# Patient Record
Sex: Female | Born: 1940 | Race: White | Hispanic: No | Marital: Married
Health system: Southern US, Academic
[De-identification: ages and names within clinical notes are randomized; demographics above are authoritative.]

## PROBLEM LIST (undated history)

## (undated) ENCOUNTER — Ambulatory Visit: Payer: PRIVATE HEALTH INSURANCE

## (undated) ENCOUNTER — Encounter: Attending: Hematology & Oncology | Primary: Hematology & Oncology

## (undated) ENCOUNTER — Encounter: Attending: Family | Primary: Family

## (undated) ENCOUNTER — Telehealth

## (undated) ENCOUNTER — Ambulatory Visit: Payer: MEDICARE

## (undated) ENCOUNTER — Ambulatory Visit
Payer: MEDICARE | Attending: Student in an Organized Health Care Education/Training Program | Primary: Student in an Organized Health Care Education/Training Program

## (undated) ENCOUNTER — Telehealth: Attending: Hematology & Oncology | Primary: Hematology & Oncology

## (undated) ENCOUNTER — Encounter
Attending: Student in an Organized Health Care Education/Training Program | Primary: Student in an Organized Health Care Education/Training Program

## (undated) ENCOUNTER — Ambulatory Visit: Payer: MEDICARE | Attending: Hematology & Oncology | Primary: Hematology & Oncology

## (undated) ENCOUNTER — Encounter

## (undated) ENCOUNTER — Other Ambulatory Visit

## (undated) ENCOUNTER — Telehealth: Attending: Family | Primary: Family

## (undated) ENCOUNTER — Ambulatory Visit

## (undated) ENCOUNTER — Telehealth
Attending: Student in an Organized Health Care Education/Training Program | Primary: Student in an Organized Health Care Education/Training Program

## (undated) ENCOUNTER — Other Ambulatory Visit: Payer: MEDICARE

## (undated) DIAGNOSIS — Z8673 Personal history of transient ischemic attack (TIA), and cerebral infarction without residual deficits: Secondary | ICD-10-CM

---

## 2014-06-12 DIAGNOSIS — N6011 Diffuse cystic mastopathy of right breast: Secondary | ICD-10-CM | POA: Insufficient documentation

## 2016-06-24 ENCOUNTER — Other Ambulatory Visit: Payer: Self-pay | Admitting: Internal Medicine

## 2016-06-24 ENCOUNTER — Other Ambulatory Visit: Payer: Self-pay | Admitting: Hematology

## 2016-06-24 ENCOUNTER — Ambulatory Visit
Admission: RE | Admit: 2016-06-24 | Discharge: 2016-06-24 | Disposition: A | Discharge status: Home / Self Care | Payer: Medicare Other | Source: Ambulatory Visit | Attending: Internal Medicine | Admitting: Internal Medicine

## 2016-06-24 DIAGNOSIS — R600 Localized edema: Secondary | ICD-10-CM | POA: Diagnosis present

## 2016-11-03 DIAGNOSIS — E119 Type 2 diabetes mellitus without complications: Secondary | ICD-10-CM

## 2017-11-22 IMAGING — US US EXTREM LOW VENOUS BILAT
1 series · 13 of 24 positions shown · non-contrast
Comparison: None.

CLINICAL DATA: 75-year-old female with left greater than right
lower extremity edema for the past week



[Series 1: us extrem low venous bilat · 13 of 68 slices shown]
[im 1/68]
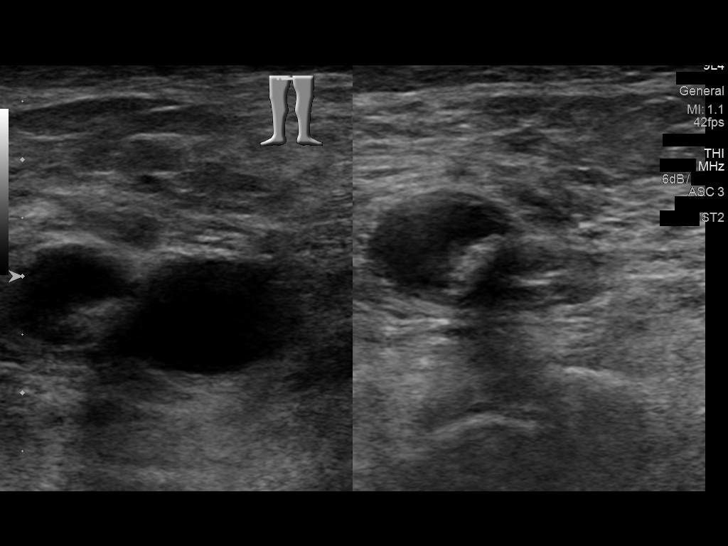
[im 6/68]
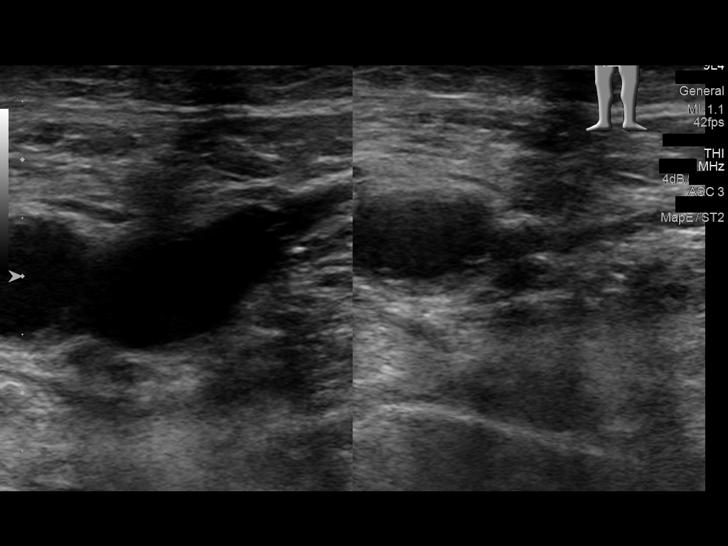
[im 12/68]
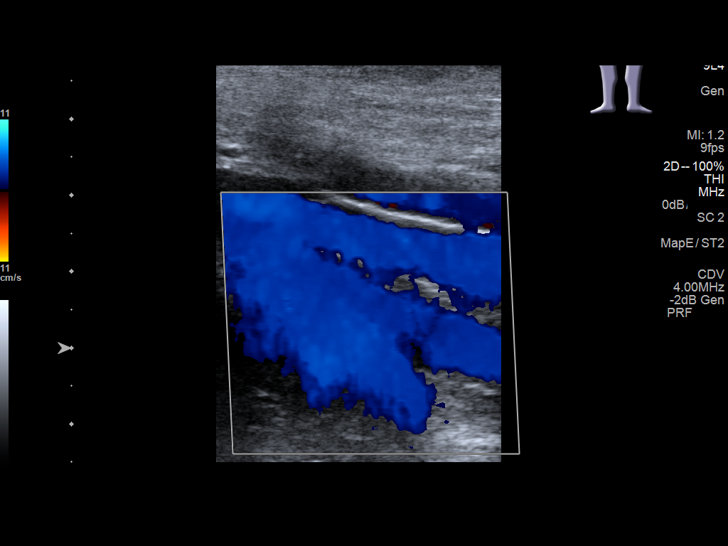
[im 18/68]
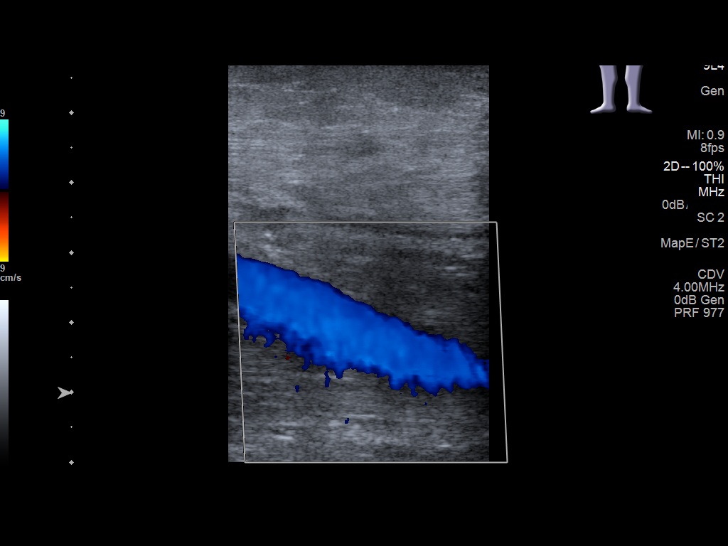
[im 24/68]
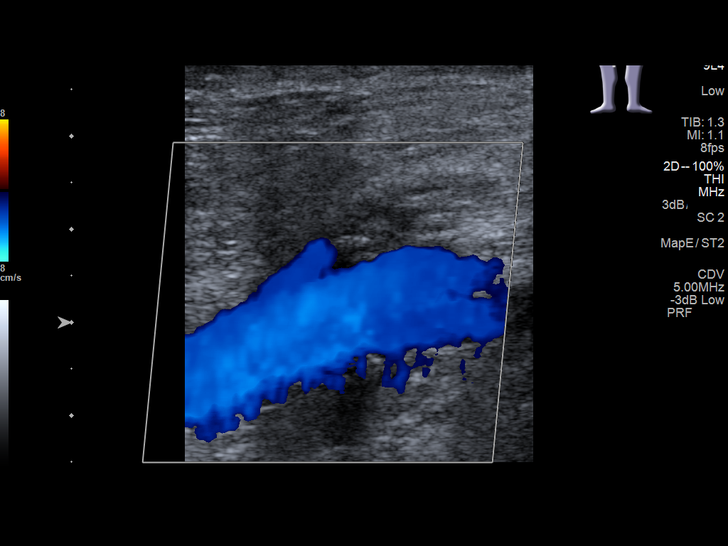
[im 30/68]
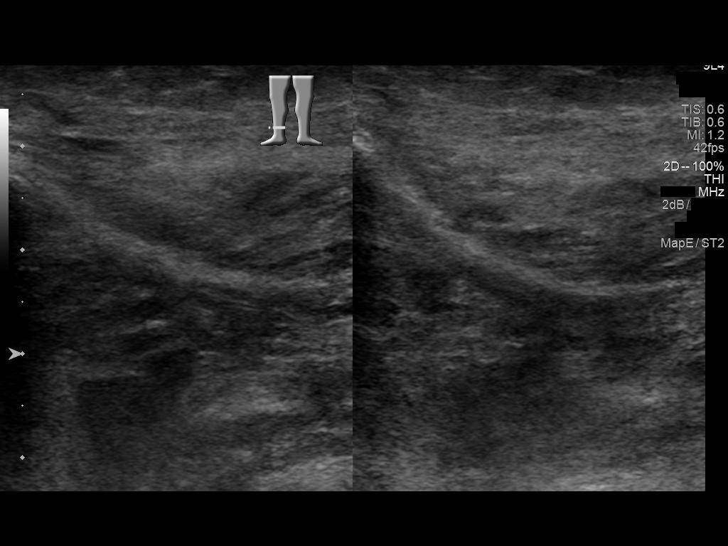
[im 35/68]
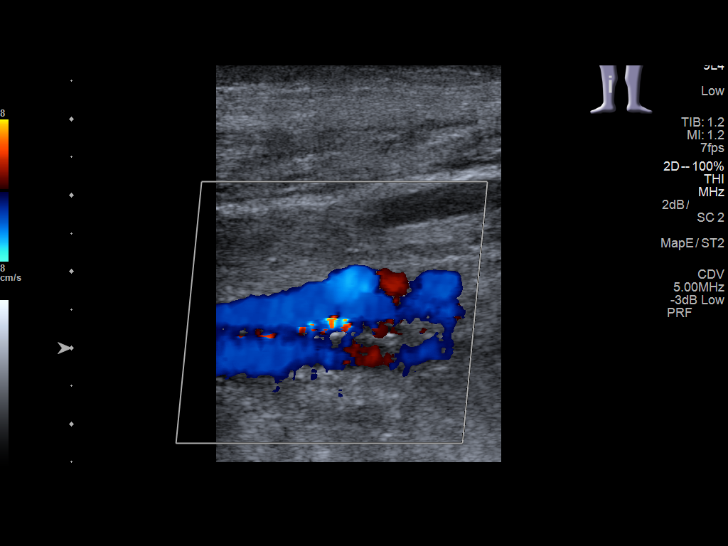
[im 38/68]
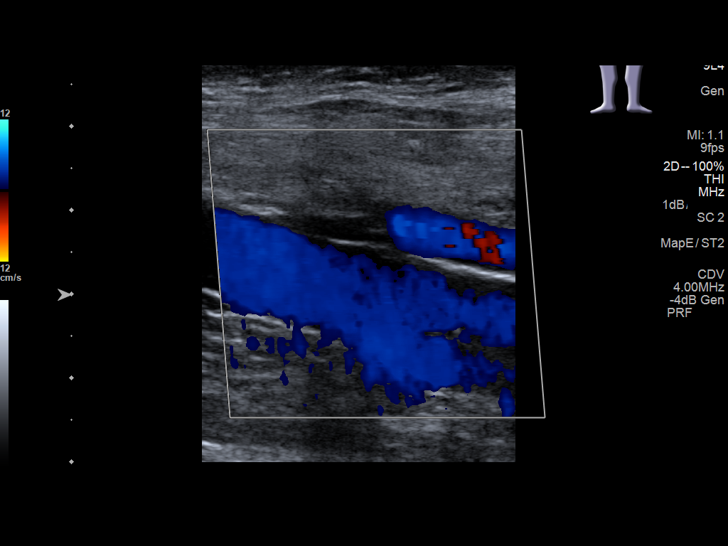
[im 44/68]
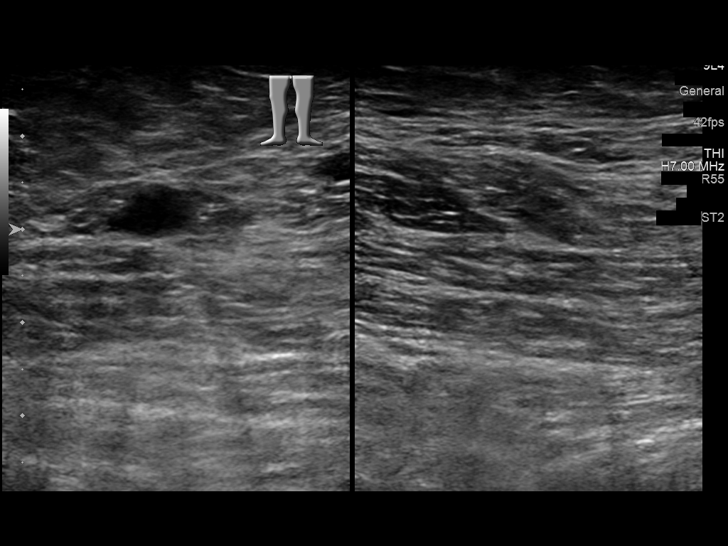
[im 50/68]
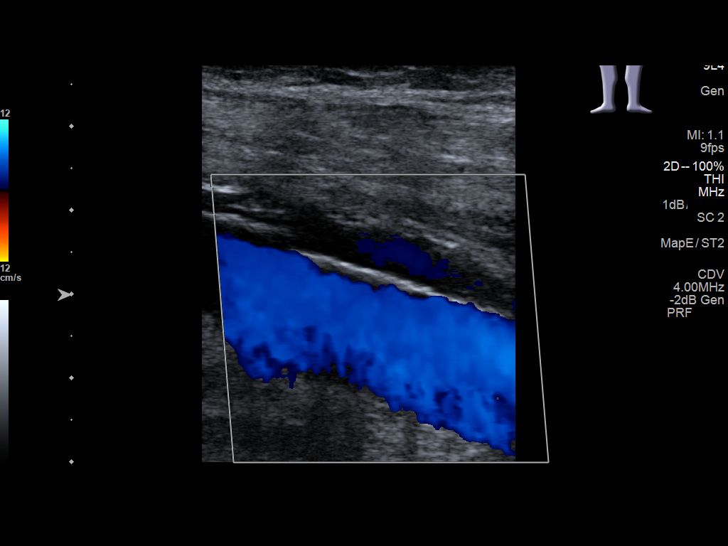
[im 56/68]
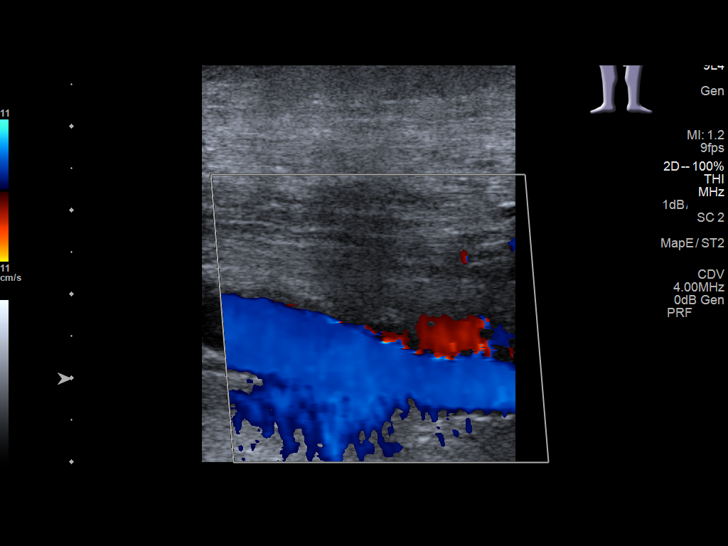
[im 62/68]
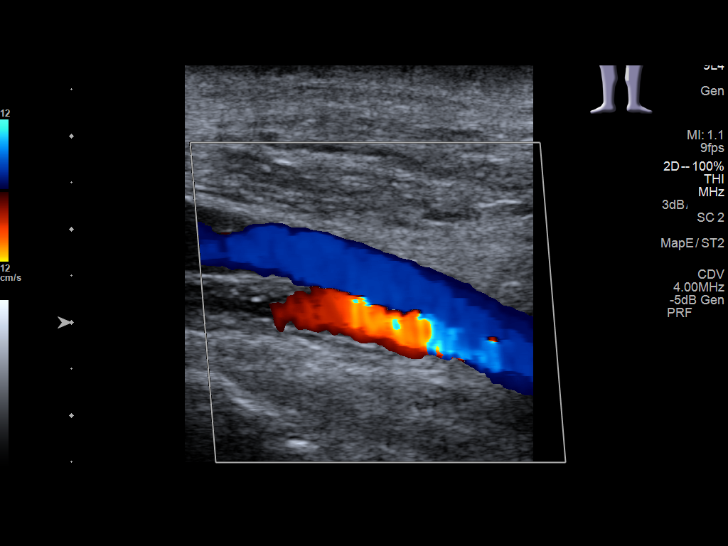
[im 68/68]
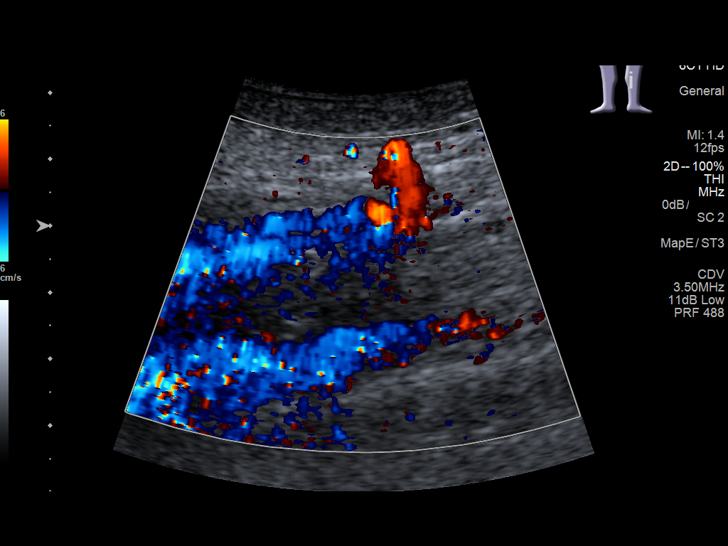

[13 of 24 positions shown; findings below may reference images not displayed]

FINDINGS: RIGHT LOWER EXTREMITY

Common Femoral Vein: No evidence of thrombus. Normal
compressibility, respiratory phasicity and response to augmentation.

Saphenofemoral Junction: No evidence of thrombus. Normal
compressibility and flow on color Doppler imaging.

Profunda Femoral Vein: No evidence of thrombus. Normal
compressibility and flow on color Doppler imaging.

Femoral Vein: No evidence of thrombus. Normal compressibility,
respiratory phasicity and response to augmentation.

Popliteal Vein: No evidence of thrombus. Normal compressibility,
respiratory phasicity and response to augmentation.

Calf Veins: No evidence of thrombus. Normal compressibility and flow
on color Doppler imaging.

Superficial Great Saphenous Vein: No evidence of thrombus. Normal
compressibility and flow on color Doppler imaging.

Venous Reflux:  None.

Other Findings:  None.

LEFT LOWER EXTREMITY

Common Femoral Vein: No evidence of thrombus. Normal
compressibility, respiratory phasicity and response to augmentation.

Saphenofemoral Junction: No evidence of thrombus. Normal
compressibility and flow on color Doppler imaging.

Profunda Femoral Vein: No evidence of thrombus. Normal
compressibility and flow on color Doppler imaging.

Femoral Vein: No evidence of thrombus. Normal compressibility,
respiratory phasicity and response to augmentation.

Popliteal Vein: No evidence of thrombus. Normal compressibility,
respiratory phasicity and response to augmentation.

Calf Veins: No evidence of thrombus. Normal compressibility and flow
on color Doppler imaging.

Superficial Great Saphenous Vein: No evidence of thrombus. Normal
compressibility and flow on color Doppler imaging.

Venous Reflux:  None.

Other Findings:  None.
IMPRESSION: No evidence of deep venous thrombosis.

## 2022-05-10 DIAGNOSIS — Z96651 Presence of right artificial knee joint: Secondary | ICD-10-CM | POA: Insufficient documentation

## 2022-05-10 DIAGNOSIS — M1990 Unspecified osteoarthritis, unspecified site: Secondary | ICD-10-CM | POA: Insufficient documentation

## 2022-05-10 DIAGNOSIS — I69351 Hemiplegia and hemiparesis following cerebral infarction affecting right dominant side: Secondary | ICD-10-CM

## 2022-05-10 DIAGNOSIS — K219 Gastro-esophageal reflux disease without esophagitis: Secondary | ICD-10-CM | POA: Insufficient documentation

## 2022-05-10 DIAGNOSIS — I10 Essential (primary) hypertension: Secondary | ICD-10-CM | POA: Diagnosis present

## 2022-05-10 DIAGNOSIS — I7300 Raynaud's syndrome without gangrene: Secondary | ICD-10-CM | POA: Insufficient documentation

## 2022-05-10 DIAGNOSIS — E785 Hyperlipidemia, unspecified: Secondary | ICD-10-CM | POA: Insufficient documentation

## 2022-07-11 ENCOUNTER — Ambulatory Visit: Admit: 2022-07-11 | Discharge: 2022-07-11 | Disposition: A | Discharge status: Home / Self Care | Payer: MEDICARE

## 2022-07-11 ENCOUNTER — Emergency Department: Admit: 2022-07-11 | Discharge: 2022-07-11 | Disposition: A | Discharge status: Home / Self Care | Payer: MEDICARE

## 2022-07-11 DIAGNOSIS — S0101XA Laceration without foreign body of scalp, initial encounter: Principal | ICD-10-CM

## 2022-07-11 DIAGNOSIS — W19XXXA Unspecified fall, initial encounter: Principal | ICD-10-CM

## 2022-07-13 ENCOUNTER — Ambulatory Visit: Admit: 2022-07-13 | Discharge: 2022-07-13 | Payer: MEDICARE | Attending: Family | Primary: Family

## 2022-07-13 ENCOUNTER — Ambulatory Visit: Admit: 2022-07-13 | Discharge: 2022-07-13 | Payer: MEDICARE

## 2022-07-13 DIAGNOSIS — E119 Type 2 diabetes mellitus without complications: Principal | ICD-10-CM

## 2022-07-13 DIAGNOSIS — D649 Anemia, unspecified: Principal | ICD-10-CM

## 2022-07-13 DIAGNOSIS — Z8673 Personal history of transient ischemic attack (TIA), and cerebral infarction without residual deficits: Principal | ICD-10-CM

## 2022-07-13 DIAGNOSIS — R609 Edema, unspecified: Principal | ICD-10-CM

## 2022-07-13 DIAGNOSIS — I10 Essential (primary) hypertension: Principal | ICD-10-CM

## 2022-07-13 DIAGNOSIS — R7989 Other specified abnormal findings of blood chemistry: Principal | ICD-10-CM

## 2022-07-13 MED ORDER — ATORVASTATIN 80 MG TABLET
ORAL_TABLET | Freq: Every day | ORAL | 0 refills | 90 days | Status: CP
Start: 2022-07-13 — End: ?

## 2022-07-13 MED ORDER — PANTOPRAZOLE 20 MG TABLET,DELAYED RELEASE
ORAL_TABLET | Freq: Every day | ORAL | 0 refills | 90 days | Status: CP
Start: 2022-07-13 — End: ?

## 2022-07-13 MED ORDER — CLOPIDOGREL 75 MG TABLET
ORAL_TABLET | Freq: Every day | ORAL | 0 refills | 90 days | Status: CP
Start: 2022-07-13 — End: ?

## 2022-07-13 MED ORDER — METFORMIN 500 MG TABLET
ORAL_TABLET | Freq: Two times a day (BID) | ORAL | 0 refills | 90 days | Status: CP
Start: 2022-07-13 — End: ?

## 2022-07-13 MED ORDER — TAMSULOSIN 0.4 MG CAPSULE
ORAL_CAPSULE | Freq: Every day | ORAL | 0 refills | 90 days | Status: CP
Start: 2022-07-13 — End: ?

## 2022-07-13 MED ORDER — DONEPEZIL 5 MG TABLET
ORAL_TABLET | Freq: Every evening | ORAL | 0 refills | 30 days | Status: CP
Start: 2022-07-13 — End: 2022-08-12

## 2022-07-13 MED ORDER — FUROSEMIDE 20 MG TABLET
ORAL_TABLET | Freq: Every day | ORAL | 0 refills | 60 days | Status: CP | PRN
Start: 2022-07-13 — End: 2022-08-12

## 2022-07-13 MED ORDER — MIRTAZAPINE 15 MG DISINTEGRATING TABLET
ORAL_TABLET | Freq: Every evening | ORAL | 0 refills | 90 days | Status: CP
Start: 2022-07-13 — End: ?

## 2022-07-13 MED ORDER — TELMISARTAN 80 MG TABLET
ORAL_TABLET | Freq: Every day | ORAL | 0 refills | 90 days | Status: CP
Start: 2022-07-13 — End: ?

## 2022-07-13 MED ORDER — AMLODIPINE 5 MG TABLET
ORAL_TABLET | Freq: Every day | ORAL | 0 refills | 90 days | Status: CP
Start: 2022-07-13 — End: ?

## 2022-07-19 DIAGNOSIS — E876 Hypokalemia: Principal | ICD-10-CM

## 2022-07-19 MED ORDER — POTASSIUM CHLORIDE ER 20 MEQ TABLET,EXTENDED RELEASE(PART/CRYST)
ORAL_TABLET | Freq: Three times a day (TID) | ORAL | 0 refills | 7 days | Status: CP
Start: 2022-07-19 — End: 2022-07-26

## 2022-07-27 DIAGNOSIS — R197 Diarrhea, unspecified: Principal | ICD-10-CM

## 2022-07-29 ENCOUNTER — Encounter: Admit: 2022-07-29 | Discharge: 2022-07-30 | Payer: MEDICARE | Attending: Family | Primary: Family

## 2022-07-29 ENCOUNTER — Other Ambulatory Visit: Admit: 2022-07-29 | Discharge: 2022-07-30 | Payer: MEDICARE

## 2022-07-29 DIAGNOSIS — R197 Diarrhea, unspecified: Principal | ICD-10-CM

## 2022-08-04 DIAGNOSIS — R197 Diarrhea, unspecified: Principal | ICD-10-CM

## 2022-08-04 DIAGNOSIS — I10 Essential (primary) hypertension: Principal | ICD-10-CM

## 2022-08-04 DIAGNOSIS — E119 Type 2 diabetes mellitus without complications: Principal | ICD-10-CM

## 2022-08-04 DIAGNOSIS — Z8673 Personal history of transient ischemic attack (TIA), and cerebral infarction without residual deficits: Principal | ICD-10-CM

## 2022-08-04 DIAGNOSIS — R609 Edema, unspecified: Principal | ICD-10-CM

## 2022-08-05 MED ORDER — METFORMIN 500 MG TABLET
ORAL_TABLET | Freq: Two times a day (BID) | ORAL | 0 refills | 90 days | Status: CP
Start: 2022-08-05 — End: ?

## 2022-08-05 MED ORDER — ATORVASTATIN 80 MG TABLET
ORAL_TABLET | Freq: Every day | ORAL | 0 refills | 90 days | Status: CP
Start: 2022-08-05 — End: ?

## 2022-08-05 MED ORDER — METHOCARBAMOL 750 MG TABLET
ORAL_TABLET | Freq: Four times a day (QID) | ORAL | 2 refills | 30 days | Status: CP
Start: 2022-08-05 — End: 2022-09-04

## 2022-08-05 MED ORDER — CLOPIDOGREL 75 MG TABLET
ORAL_TABLET | Freq: Every day | ORAL | 0 refills | 90 days | Status: CP
Start: 2022-08-05 — End: ?

## 2022-08-05 MED ORDER — MIRTAZAPINE 15 MG DISINTEGRATING TABLET
ORAL_TABLET | Freq: Every evening | ORAL | 0 refills | 90 days | Status: CP
Start: 2022-08-05 — End: ?

## 2022-08-05 MED ORDER — AMLODIPINE 5 MG TABLET
ORAL_TABLET | Freq: Every day | ORAL | 0 refills | 90 days | Status: CP
Start: 2022-08-05 — End: ?

## 2022-08-05 MED ORDER — PANTOPRAZOLE 20 MG TABLET,DELAYED RELEASE
ORAL_TABLET | Freq: Every day | ORAL | 0 refills | 90 days | Status: CP
Start: 2022-08-05 — End: ?

## 2022-08-05 MED ORDER — TELMISARTAN 80 MG TABLET
ORAL_TABLET | Freq: Every day | ORAL | 0 refills | 90 days | Status: CP
Start: 2022-08-05 — End: ?

## 2022-08-05 MED ORDER — FUROSEMIDE 20 MG TABLET
ORAL_TABLET | Freq: Every day | ORAL | 0 refills | 60 days | Status: CP | PRN
Start: 2022-08-05 — End: 2022-09-04

## 2022-08-08 MED ORDER — AMLODIPINE 5 MG TABLET
ORAL_TABLET | Freq: Two times a day (BID) | ORAL | 1 refills | 45 days | Status: CP
Start: 2022-08-08 — End: 2022-11-06

## 2022-08-08 MED ORDER — TAMSULOSIN 0.4 MG CAPSULE
ORAL_CAPSULE | Freq: Every day | ORAL | 1 refills | 90 days | Status: CP
Start: 2022-08-08 — End: ?

## 2022-08-15 ENCOUNTER — Ambulatory Visit: Admit: 2022-08-15 | Discharge: 2022-08-16 | Payer: MEDICARE

## 2022-08-15 DIAGNOSIS — Z8673 Personal history of transient ischemic attack (TIA), and cerebral infarction without residual deficits: Principal | ICD-10-CM

## 2022-08-15 DIAGNOSIS — I639 Cerebral infarction, unspecified: Principal | ICD-10-CM

## 2022-08-17 ENCOUNTER — Ambulatory Visit
Admit: 2022-08-17 | Discharge: 2022-08-18 | Payer: MEDICARE | Attending: Student in an Organized Health Care Education/Training Program | Primary: Student in an Organized Health Care Education/Training Program

## 2022-08-17 ENCOUNTER — Ambulatory Visit: Admit: 2022-08-17 | Discharge: 2022-08-18 | Payer: MEDICARE

## 2022-08-17 DIAGNOSIS — K5900 Constipation, unspecified: Principal | ICD-10-CM

## 2022-08-17 DIAGNOSIS — R197 Diarrhea, unspecified: Principal | ICD-10-CM

## 2022-08-29 MED ORDER — DONEPEZIL 10 MG TABLET
ORAL_TABLET | Freq: Every evening | ORAL | 2 refills | 30 days | Status: CP
Start: 2022-08-29 — End: 2022-09-28

## 2022-09-12 MED ORDER — AMLODIPINE 5 MG TABLET
ORAL_TABLET | Freq: Two times a day (BID) | ORAL | 1 refills | 45 days | Status: CP
Start: 2022-09-12 — End: 2022-12-11

## 2022-09-12 MED ORDER — BLOOD GLUCOSE TEST STRIPS
ORAL_STRIP | Freq: Once | 2 refills | 0 days | Status: CP
Start: 2022-09-12 — End: 2022-09-12

## 2022-09-19 DIAGNOSIS — E119 Type 2 diabetes mellitus without complications: Principal | ICD-10-CM

## 2022-09-19 MED ORDER — BLOOD-GLUCOSE METER KIT WRAPPER
0 refills | 0 days | Status: CP
Start: 2022-09-19 — End: ?

## 2022-10-03 ENCOUNTER — Ambulatory Visit
Admit: 2022-10-03 | Discharge: 2022-10-04 | Payer: MEDICARE | Attending: Hematology & Oncology | Primary: Hematology & Oncology

## 2022-10-03 DIAGNOSIS — K219 Gastro-esophageal reflux disease without esophagitis: Principal | ICD-10-CM

## 2022-10-03 DIAGNOSIS — E7800 Pure hypercholesterolemia, unspecified: Principal | ICD-10-CM

## 2022-10-03 DIAGNOSIS — I10 Essential (primary) hypertension: Principal | ICD-10-CM

## 2022-10-03 DIAGNOSIS — Z8673 Personal history of transient ischemic attack (TIA), and cerebral infarction without residual deficits: Principal | ICD-10-CM

## 2022-10-03 DIAGNOSIS — S81819A Laceration without foreign body, unspecified lower leg, initial encounter: Principal | ICD-10-CM

## 2022-10-03 DIAGNOSIS — Z1329 Encounter for screening for other suspected endocrine disorder: Principal | ICD-10-CM

## 2022-10-03 DIAGNOSIS — Z131 Encounter for screening for diabetes mellitus: Principal | ICD-10-CM

## 2022-10-03 DIAGNOSIS — E119 Type 2 diabetes mellitus without complications: Principal | ICD-10-CM

## 2022-10-03 DIAGNOSIS — Z7689 Persons encountering health services in other specified circumstances: Principal | ICD-10-CM

## 2022-10-03 MED ORDER — CEPHALEXIN 500 MG CAPSULE
ORAL_CAPSULE | Freq: Three times a day (TID) | ORAL | 0 refills | 7 days | Status: CP
Start: 2022-10-03 — End: 2022-10-10

## 2022-10-12 DIAGNOSIS — R609 Edema, unspecified: Principal | ICD-10-CM

## 2022-10-17 DIAGNOSIS — I639 Cerebral infarction, unspecified: Principal | ICD-10-CM

## 2022-10-31 DIAGNOSIS — Z8673 Personal history of transient ischemic attack (TIA), and cerebral infarction without residual deficits: Principal | ICD-10-CM

## 2022-10-31 DIAGNOSIS — E119 Type 2 diabetes mellitus without complications: Principal | ICD-10-CM

## 2022-11-01 MED ORDER — ATORVASTATIN 80 MG TABLET
ORAL_TABLET | Freq: Every day | ORAL | 0 refills | 90 days | Status: CP
Start: 2022-11-01 — End: ?

## 2022-11-01 MED ORDER — PANTOPRAZOLE 20 MG TABLET,DELAYED RELEASE
ORAL_TABLET | Freq: Every day | ORAL | 0 refills | 90 days | Status: CP
Start: 2022-11-01 — End: ?

## 2022-11-01 MED ORDER — MIRTAZAPINE 15 MG DISINTEGRATING TABLET
ORAL_TABLET | Freq: Every evening | ORAL | 0 refills | 90 days | Status: CP
Start: 2022-11-01 — End: ?

## 2022-11-14 ENCOUNTER — Ambulatory Visit: Admit: 2022-11-14 | Discharge: 2022-11-15 | Payer: MEDICARE

## 2022-11-14 DIAGNOSIS — R413 Other amnesia: Principal | ICD-10-CM

## 2022-11-14 DIAGNOSIS — I6389 Other cerebral infarction: Principal | ICD-10-CM

## 2022-11-14 DIAGNOSIS — E119 Type 2 diabetes mellitus without complications: Principal | ICD-10-CM

## 2022-11-14 DIAGNOSIS — I639 Cerebral infarction, unspecified: Principal | ICD-10-CM

## 2022-11-15 MED ORDER — METFORMIN 500 MG TABLET
ORAL_TABLET | Freq: Two times a day (BID) | ORAL | 1 refills | 90 days | Status: CP
Start: 2022-11-15 — End: 2023-11-15

## 2022-11-16 ENCOUNTER — Ambulatory Visit
Admit: 2022-11-16 | Discharge: 2022-11-17 | Payer: MEDICARE | Attending: Student in an Organized Health Care Education/Training Program | Primary: Student in an Organized Health Care Education/Training Program

## 2022-11-21 ENCOUNTER — Ambulatory Visit: Admit: 2022-11-21 | Discharge: 2022-11-22 | Payer: MEDICARE

## 2022-11-27 DIAGNOSIS — Z8673 Personal history of transient ischemic attack (TIA), and cerebral infarction without residual deficits: Principal | ICD-10-CM

## 2022-11-29 MED ORDER — CLOPIDOGREL 75 MG TABLET
ORAL_TABLET | Freq: Every day | ORAL | 0 refills | 90 days | Status: CP
Start: 2022-11-29 — End: ?

## 2022-11-29 MED ORDER — DONEPEZIL 10 MG TABLET
ORAL_TABLET | Freq: Every evening | ORAL | 0 refills | 90 days | Status: CP
Start: 2022-11-29 — End: 2023-02-27

## 2022-12-06 DIAGNOSIS — I10 Essential (primary) hypertension: Principal | ICD-10-CM

## 2022-12-07 MED ORDER — TELMISARTAN 80 MG TABLET
ORAL_TABLET | Freq: Every day | ORAL | 0 refills | 90 days | Status: CP
Start: 2022-12-07 — End: ?

## 2022-12-20 MED ORDER — AMLODIPINE 5 MG TABLET
ORAL_TABLET | Freq: Two times a day (BID) | ORAL | 1 refills | 45 days | Status: CP
Start: 2022-12-20 — End: 2023-03-20

## 2023-01-02 ENCOUNTER — Ambulatory Visit: Admit: 2023-01-02 | Discharge: 2023-01-03 | Payer: MEDICARE | Attending: Family | Primary: Family

## 2023-01-02 DIAGNOSIS — E119 Type 2 diabetes mellitus without complications: Principal | ICD-10-CM

## 2023-01-23 MED ORDER — PANTOPRAZOLE 20 MG TABLET,DELAYED RELEASE
ORAL_TABLET | Freq: Every day | ORAL | 0 refills | 90 days | Status: CP
Start: 2023-01-23 — End: ?

## 2023-02-02 ENCOUNTER — Ambulatory Visit
Admit: 2023-02-02 | Discharge: 2023-02-03 | Payer: MEDICARE | Attending: Hematology & Oncology | Primary: Hematology & Oncology

## 2023-02-02 DIAGNOSIS — Z8673 Personal history of transient ischemic attack (TIA), and cerebral infarction without residual deficits: Principal | ICD-10-CM

## 2023-02-02 DIAGNOSIS — Z13820 Encounter for screening for osteoporosis: Principal | ICD-10-CM

## 2023-02-02 DIAGNOSIS — E119 Type 2 diabetes mellitus without complications: Principal | ICD-10-CM

## 2023-02-02 DIAGNOSIS — Z1329 Encounter for screening for other suspected endocrine disorder: Principal | ICD-10-CM

## 2023-02-02 DIAGNOSIS — Z79899 Other long term (current) drug therapy: Principal | ICD-10-CM

## 2023-02-02 DIAGNOSIS — I10 Essential (primary) hypertension: Principal | ICD-10-CM

## 2023-02-02 DIAGNOSIS — Z0000 Encounter for general adult medical examination without abnormal findings: Principal | ICD-10-CM

## 2023-02-02 DIAGNOSIS — K219 Gastro-esophageal reflux disease without esophagitis: Principal | ICD-10-CM

## 2023-02-02 DIAGNOSIS — E7800 Pure hypercholesterolemia, unspecified: Principal | ICD-10-CM

## 2023-02-02 DIAGNOSIS — Z130 Encounter for screening for diseases of the blood and blood-forming organs and certain disorders involving the immune mechanism: Principal | ICD-10-CM

## 2023-02-02 MED ORDER — METFORMIN 500 MG TABLET
ORAL_TABLET | Freq: Two times a day (BID) | ORAL | 1 refills | 90 days | Status: CP
Start: 2023-02-02 — End: 2024-02-02

## 2023-02-04 ENCOUNTER — Encounter
Admission: EM | Disposition: A | Discharge status: Skilled Nursing Facility | Payer: Self-pay | Source: Home / Self Care | Attending: Internal Medicine

## 2023-02-04 ENCOUNTER — Inpatient Hospital Stay: Payer: Medicare Other

## 2023-02-04 ENCOUNTER — Inpatient Hospital Stay: Payer: Medicare Other | Admitting: Anesthesiology

## 2023-02-04 ENCOUNTER — Emergency Department: Payer: Medicare Other

## 2023-02-04 ENCOUNTER — Other Ambulatory Visit: Payer: Self-pay

## 2023-02-04 ENCOUNTER — Inpatient Hospital Stay
Admission: EM | Admit: 2023-02-04 | Discharge: 2023-02-09 | DRG: 956 | Disposition: A | Discharge status: Skilled Nursing Facility | Payer: Medicare Other | Attending: Internal Medicine | Admitting: Internal Medicine

## 2023-02-04 ENCOUNTER — Encounter: Payer: Self-pay | Admitting: Internal Medicine

## 2023-02-04 DIAGNOSIS — Y92039 Unspecified place in apartment as the place of occurrence of the external cause: Secondary | ICD-10-CM

## 2023-02-04 DIAGNOSIS — K5909 Other constipation: Secondary | ICD-10-CM | POA: Diagnosis present

## 2023-02-04 DIAGNOSIS — N179 Acute kidney failure, unspecified: Secondary | ICD-10-CM | POA: Diagnosis not present

## 2023-02-04 DIAGNOSIS — E785 Hyperlipidemia, unspecified: Secondary | ICD-10-CM | POA: Diagnosis present

## 2023-02-04 DIAGNOSIS — E860 Dehydration: Secondary | ICD-10-CM | POA: Diagnosis present

## 2023-02-04 DIAGNOSIS — S72141A Displaced intertrochanteric fracture of right femur, initial encounter for closed fracture: Secondary | ICD-10-CM | POA: Diagnosis not present

## 2023-02-04 DIAGNOSIS — K219 Gastro-esophageal reflux disease without esophagitis: Secondary | ICD-10-CM | POA: Diagnosis present

## 2023-02-04 DIAGNOSIS — R4189 Other symptoms and signs involving cognitive functions and awareness: Secondary | ICD-10-CM | POA: Diagnosis present

## 2023-02-04 DIAGNOSIS — I69351 Hemiplegia and hemiparesis following cerebral infarction affecting right dominant side: Secondary | ICD-10-CM | POA: Diagnosis not present

## 2023-02-04 DIAGNOSIS — S32591A Other specified fracture of right pubis, initial encounter for closed fracture: Secondary | ICD-10-CM | POA: Diagnosis present

## 2023-02-04 DIAGNOSIS — W19XXXA Unspecified fall, initial encounter: Secondary | ICD-10-CM

## 2023-02-04 DIAGNOSIS — W1830XA Fall on same level, unspecified, initial encounter: Secondary | ICD-10-CM | POA: Diagnosis not present

## 2023-02-04 DIAGNOSIS — F03918 Unspecified dementia, unspecified severity, with other behavioral disturbance: Secondary | ICD-10-CM | POA: Diagnosis present

## 2023-02-04 DIAGNOSIS — F09 Unspecified mental disorder due to known physiological condition: Secondary | ICD-10-CM

## 2023-02-04 DIAGNOSIS — E119 Type 2 diabetes mellitus without complications: Secondary | ICD-10-CM | POA: Diagnosis present

## 2023-02-04 DIAGNOSIS — Z9181 History of falling: Secondary | ICD-10-CM

## 2023-02-04 DIAGNOSIS — I10 Essential (primary) hypertension: Secondary | ICD-10-CM | POA: Diagnosis present

## 2023-02-04 DIAGNOSIS — S32511A Fracture of superior rim of right pubis, initial encounter for closed fracture: Secondary | ICD-10-CM | POA: Diagnosis not present

## 2023-02-04 DIAGNOSIS — Y9301 Activity, walking, marching and hiking: Secondary | ICD-10-CM | POA: Diagnosis present

## 2023-02-04 DIAGNOSIS — Z7984 Long term (current) use of oral hypoglycemic drugs: Secondary | ICD-10-CM | POA: Diagnosis not present

## 2023-02-04 DIAGNOSIS — W010XXA Fall on same level from slipping, tripping and stumbling without subsequent striking against object, initial encounter: Secondary | ICD-10-CM | POA: Diagnosis present

## 2023-02-04 DIAGNOSIS — D62 Acute posthemorrhagic anemia: Secondary | ICD-10-CM | POA: Diagnosis not present

## 2023-02-04 HISTORY — DX: Personal history of transient ischemic attack (TIA), and cerebral infarction without residual deficits: Z86.73

## 2023-02-04 HISTORY — PX: INTRAMEDULLARY (IM) NAIL INTERTROCHANTERIC: SHX5875

## 2023-02-04 LAB — HEMOGLOBIN A1C
Hgb A1c MFr Bld: 7.3 % — ABNORMAL HIGH (ref 4.8–5.6)
Mean Plasma Glucose: 162.81 mg/dL

## 2023-02-04 LAB — CBC WITH DIFFERENTIAL/PLATELET
Abs Immature Granulocytes: 0.08 10*3/uL — ABNORMAL HIGH (ref 0.00–0.07)
Basophils Absolute: 0.1 10*3/uL (ref 0.0–0.1)
Basophils Relative: 1 %
Eosinophils Absolute: 0.2 10*3/uL (ref 0.0–0.5)
Eosinophils Relative: 2 %
HCT: 31.7 % — ABNORMAL LOW (ref 36.0–46.0)
Hemoglobin: 10.1 g/dL — ABNORMAL LOW (ref 12.0–15.0)
Immature Granulocytes: 1 %
Lymphocytes Relative: 39 %
Lymphs Abs: 4.8 10*3/uL — ABNORMAL HIGH (ref 0.7–4.0)
MCH: 27.1 pg (ref 26.0–34.0)
MCHC: 31.9 g/dL (ref 30.0–36.0)
MCV: 85 fL (ref 80.0–100.0)
Monocytes Absolute: 0.9 10*3/uL (ref 0.1–1.0)
Monocytes Relative: 7 %
Neutro Abs: 6.1 10*3/uL (ref 1.7–7.7)
Neutrophils Relative %: 50 %
Platelets: 233 10*3/uL (ref 150–400)
RBC: 3.73 MIL/uL — ABNORMAL LOW (ref 3.87–5.11)
RDW: 16.4 % — ABNORMAL HIGH (ref 11.5–15.5)
WBC: 12.1 10*3/uL — ABNORMAL HIGH (ref 4.0–10.5)
nRBC: 0 % (ref 0.0–0.2)

## 2023-02-04 LAB — GLUCOSE, CAPILLARY
Glucose-Capillary: 160 mg/dL — ABNORMAL HIGH (ref 70–99)
Glucose-Capillary: 150 mg/dL — ABNORMAL HIGH (ref 70–99)
Glucose-Capillary: 161 mg/dL — ABNORMAL HIGH (ref 70–99)
Glucose-Capillary: 150 mg/dL — ABNORMAL HIGH (ref 70–99)
Glucose-Capillary: 211 mg/dL — ABNORMAL HIGH (ref 70–99)
Glucose-Capillary: 210 mg/dL — ABNORMAL HIGH (ref 70–99)

## 2023-02-04 LAB — BASIC METABOLIC PANEL
Anion gap: 12 (ref 5–15)
BUN: 20 mg/dL (ref 8–23)
CO2: 22 mmol/L (ref 22–32)
Calcium: 9.6 mg/dL (ref 8.9–10.3)
Chloride: 103 mmol/L (ref 98–111)
Creatinine, Ser: 1.06 mg/dL — ABNORMAL HIGH (ref 0.44–1.00)
GFR, Estimated: 53 mL/min — ABNORMAL LOW (ref >60)
Glucose, Bld: 157 mg/dL — ABNORMAL HIGH (ref 70–99)
Potassium: 3.8 mmol/L (ref 3.5–5.1)
Sodium: 137 mmol/L (ref 135–145)

## 2023-02-04 LAB — TYPE AND SCREEN

## 2023-02-04 LAB — PROTIME-INR
INR: 1.1 (ref 0.8–1.2)
Prothrombin Time: 14.2 seconds (ref 11.4–15.2)

## 2023-02-04 SURGERY — FIXATION, FRACTURE, INTERTROCHANTERIC, WITH INTRAMEDULLARY ROD
Anesthesia: General | Laterality: Right

## 2023-02-04 MED ORDER — EPHEDRINE SULFATE (PRESSORS) 50 MG/ML IJ SOLN
INTRAMUSCULAR | Status: DC | PRN
  Administered 2023-02-04 (×3): 2.5 mg via INTRAVENOUS

## 2023-02-04 MED ORDER — INSULIN ASPART 100 UNIT/ML IJ SOLN
0.0000 [IU] | INTRAMUSCULAR | Status: DC
  Administered 2023-02-04: 1 [IU] via SUBCUTANEOUS
  Administered 2023-02-04: 2 [IU] via SUBCUTANEOUS
  Administered 2023-02-04: 3 [IU] via SUBCUTANEOUS
  Administered 2023-02-04 – 2023-02-05 (×2): 2 [IU] via SUBCUTANEOUS
  Administered 2023-02-05 (×2): 3 [IU] via SUBCUTANEOUS
  Filled 2023-02-04 (×6): qty 1

## 2023-02-04 MED ORDER — ACETAMINOPHEN 10 MG/ML IV SOLN
INTRAVENOUS | Status: DC | PRN
  Administered 2023-02-04: 1000 mg via INTRAVENOUS

## 2023-02-04 MED ORDER — HYDROCODONE-ACETAMINOPHEN 5-325 MG PO TABS
1.0000 | ORAL_TABLET | Freq: Four times a day (QID) | ORAL | Status: DC | PRN
  Administered 2023-02-06 – 2023-02-09 (×5): 1 via ORAL
  Filled 2023-02-04 (×5): qty 1

## 2023-02-04 MED ORDER — CEFAZOLIN SODIUM-DEXTROSE 1-4 GM/50ML-% IV SOLN
1.0000 g | Freq: Three times a day (TID) | INTRAVENOUS | Status: AC
  Administered 2023-02-05 (×3): 1 g via INTRAVENOUS
  Filled 2023-02-04 (×4): qty 50

## 2023-02-04 MED ORDER — FENTANYL CITRATE (PF) 100 MCG/2ML IJ SOLN
INTRAMUSCULAR | Status: DC | PRN
  Administered 2023-02-04: 50 ug via INTRAVENOUS
  Administered 2023-02-04 (×2): 25 ug via INTRAVENOUS

## 2023-02-04 MED ORDER — FENTANYL CITRATE (PF) 100 MCG/2ML IJ SOLN
INTRAMUSCULAR | Status: AC
  Filled 2023-02-04: qty 2

## 2023-02-04 MED ORDER — TRANEXAMIC ACID-NACL 1000-0.7 MG/100ML-% IV SOLN
INTRAVENOUS | Status: DC | PRN
Start: 2023-02-04 — End: 2023-02-04
  Administered 2023-02-04: 1000 mg via INTRAVENOUS

## 2023-02-04 MED ORDER — HYDROCODONE-ACETAMINOPHEN 5-325 MG PO TABS: 1.0000 | ORAL_TABLET | Freq: Four times a day (QID) | ORAL | Status: DC | PRN

## 2023-02-04 MED ORDER — ACETAMINOPHEN 325 MG PO TABS
650.0000 mg | ORAL_TABLET | Freq: Four times a day (QID) | ORAL | Status: DC | PRN
  Administered 2023-02-05 – 2023-02-07 (×2): 650 mg via ORAL
  Filled 2023-02-04 (×2): qty 2

## 2023-02-04 MED ORDER — BUPIVACAINE-EPINEPHRINE (PF) 0.5% -1:200000 IJ SOLN
INTRAMUSCULAR | Status: DC | PRN
  Administered 2023-02-04: 45 mL via SURGICAL_CAVITY

## 2023-02-04 MED ORDER — HYDROMORPHONE HCL 1 MG/ML IJ SOLN
0.5000 mg | INTRAMUSCULAR | Status: DC | PRN
  Administered 2023-02-04 – 2023-02-06 (×4): 0.5 mg via INTRAVENOUS
  Filled 2023-02-04 (×4): qty 0.5

## 2023-02-04 MED ORDER — HYDRALAZINE HCL 20 MG/ML IJ SOLN: 5.0000 mg | Freq: Four times a day (QID) | INTRAMUSCULAR | Status: DC | PRN

## 2023-02-04 MED ORDER — 0.9 % SODIUM CHLORIDE (POUR BTL) OPTIME
TOPICAL | Status: DC | PRN
  Administered 2023-02-04: 1000 mL

## 2023-02-04 MED ORDER — PROPOFOL 10 MG/ML IV BOLUS
INTRAVENOUS | Status: DC | PRN
Start: 2023-02-04 — End: 2023-02-04
  Administered 2023-02-04: 80 mg via INTRAVENOUS

## 2023-02-04 MED ORDER — FENTANYL CITRATE (PF) 100 MCG/2ML IJ SOLN: 25.0000 ug | INTRAMUSCULAR | Status: DC | PRN

## 2023-02-04 MED ORDER — LACTATED RINGERS IV SOLN: INTRAVENOUS | Status: AC

## 2023-02-04 MED ORDER — ROCURONIUM BROMIDE 10 MG/ML (PF) SYRINGE
PREFILLED_SYRINGE | INTRAVENOUS | Status: AC
  Filled 2023-02-04: qty 10

## 2023-02-04 MED ORDER — ROCURONIUM BROMIDE 100 MG/10ML IV SOLN
INTRAVENOUS | Status: DC | PRN
  Administered 2023-02-04: 20 mg via INTRAVENOUS
  Administered 2023-02-04: 30 mg via INTRAVENOUS

## 2023-02-04 MED ORDER — TRANEXAMIC ACID 1000 MG/10ML IV SOLN
INTRAVENOUS | Status: DC | PRN
  Administered 2023-02-04: 1000 mg via TOPICAL

## 2023-02-04 MED ORDER — PROPOFOL 10 MG/ML IV BOLUS
INTRAVENOUS | Status: AC
  Filled 2023-02-04: qty 20

## 2023-02-04 MED ORDER — FENTANYL CITRATE PF 50 MCG/ML IJ SOSY
50.0000 ug | PREFILLED_SYRINGE | Freq: Once | INTRAMUSCULAR | Status: AC
  Administered 2023-02-04: 50 ug via INTRAVENOUS
  Filled 2023-02-04: qty 1

## 2023-02-04 MED ORDER — FENTANYL CITRATE PF 50 MCG/ML IJ SOSY
50.0000 ug | PREFILLED_SYRINGE | Freq: Once | INTRAMUSCULAR | Status: AC
  Administered 2023-02-04: 50 ug via INTRAVENOUS

## 2023-02-04 MED ORDER — LIDOCAINE HCL (PF) 2 % IJ SOLN
INTRAMUSCULAR | Status: AC
  Filled 2023-02-04: qty 5

## 2023-02-04 MED ORDER — SUGAMMADEX SODIUM 200 MG/2ML IV SOLN
INTRAVENOUS | Status: DC | PRN
  Administered 2023-02-04: 200 mg via INTRAVENOUS

## 2023-02-04 MED ORDER — ACETAMINOPHEN 10 MG/ML IV SOLN
INTRAVENOUS | Status: AC
  Filled 2023-02-04: qty 100

## 2023-02-04 MED ORDER — ONDANSETRON HCL 4 MG/2ML IJ SOLN
INTRAMUSCULAR | Status: DC | PRN
  Administered 2023-02-04: 4 mg via INTRAVENOUS

## 2023-02-04 MED ORDER — DOCUSATE SODIUM 100 MG PO CAPS: 100.0000 mg | ORAL_CAPSULE | Freq: Every day | ORAL | Status: DC | PRN

## 2023-02-04 MED ORDER — LIDOCAINE HCL (CARDIAC) PF 100 MG/5ML IV SOSY
PREFILLED_SYRINGE | INTRAVENOUS | Status: DC | PRN
  Administered 2023-02-04: 60 mg via INTRAVENOUS

## 2023-02-04 MED ORDER — FENTANYL CITRATE PF 50 MCG/ML IJ SOSY: 50.0000 ug | PREFILLED_SYRINGE | INTRAMUSCULAR | Status: DC | PRN

## 2023-02-04 MED ORDER — DOCUSATE SODIUM 100 MG PO CAPS: 100.0000 mg | ORAL_CAPSULE | Freq: Two times a day (BID) | ORAL | Status: DC

## 2023-02-04 MED ORDER — PHENYLEPHRINE 80 MCG/ML (10ML) SYRINGE FOR IV PUSH (FOR BLOOD PRESSURE SUPPORT)
PREFILLED_SYRINGE | INTRAVENOUS | Status: DC | PRN
  Administered 2023-02-04: 80 ug via INTRAVENOUS
  Administered 2023-02-04 (×2): 160 ug via INTRAVENOUS
  Administered 2023-02-04 (×4): 80 ug via INTRAVENOUS

## 2023-02-04 MED ORDER — CEFAZOLIN SODIUM-DEXTROSE 2-3 GM-%(50ML) IV SOLR
INTRAVENOUS | Status: DC | PRN
  Administered 2023-02-04: 2 g via INTRAVENOUS

## 2023-02-04 SURGICAL SUPPLY — 47 items
APL PRP STRL LF DISP 70% ISPRP (MISCELLANEOUS) ×1
BIT DRILL AO GAMMA 4.2X180 (BIT) IMPLANT
BLADE SURG 15 STRL LF DISP TIS (BLADE) ×1 IMPLANT
BLADE SURG 15 STRL SS (BLADE) ×1
CHLORAPREP W/TINT 26 (MISCELLANEOUS) ×1 IMPLANT
DRAPE 3/4 80X56 (DRAPES) ×1 IMPLANT
DRAPE SURG 17X11 SM STRL (DRAPES) ×2 IMPLANT
DRAPE U-SHAPE 47X51 STRL (DRAPES) ×2 IMPLANT
DRSG AQUACEL AG ADV 3.5X14 (GAUZE/BANDAGES/DRESSINGS) IMPLANT
DRSG MEPILEX FLEX 3X3 (GAUZE/BANDAGES/DRESSINGS) IMPLANT
DRSG MEPILEX FLEX 6X6 (GAUZE/BANDAGES/DRESSINGS) IMPLANT
DRSG OPSITE POSTOP 3X4 (GAUZE/BANDAGES/DRESSINGS) ×3 IMPLANT
DRSG XEROFORM 1X8 (GAUZE/BANDAGES/DRESSINGS) IMPLANT
ELECT REM PT RETURN 9FT ADLT (ELECTROSURGICAL) ×1
ELECTRODE REM PT RTRN 9FT ADLT (ELECTROSURGICAL) ×1 IMPLANT
GLOVE BIOGEL PI IND STRL 8 (GLOVE) ×1 IMPLANT
GLOVE SURG SYN 7.5 E (GLOVE) ×1 IMPLANT
GLOVE SURG SYN 7.5 PF PI (GLOVE) ×1 IMPLANT
GOWN STRL REUS W/ TWL LRG LVL3 (GOWN DISPOSABLE) ×1 IMPLANT
GOWN STRL REUS W/ TWL XL LVL3 (GOWN DISPOSABLE) ×1 IMPLANT
GOWN STRL REUS W/TWL LRG LVL3 (GOWN DISPOSABLE) ×1
GOWN STRL REUS W/TWL XL LVL3 (GOWN DISPOSABLE) ×1
GUIDEROD T2 3X1000 (ROD) IMPLANT
K-WIRE 3.2X450M STR (WIRE) ×3
KIT NAIL LONG RIGHT TI (Nail) IMPLANT
KIT PATIENT CARE HANA TABLE (KITS) ×1 IMPLANT
KIT TURNOVER KIT A (KITS) ×1 IMPLANT
KWIRE 3.2X450M STR (WIRE) IMPLANT
MANIFOLD NEPTUNE II (INSTRUMENTS) ×1 IMPLANT
MAT ABSORB FLUID 56X50 GRAY (MISCELLANEOUS) ×2 IMPLANT
NDL FILTER BLUNT 18X1 1/2 (NEEDLE) ×1 IMPLANT
NDL HYPO 22X1.5 SAFETY MO (MISCELLANEOUS) ×1 IMPLANT
NEEDLE FILTER BLUNT 18X1 1/2 (NEEDLE) ×1 IMPLANT
NEEDLE HYPO 22X1.5 SAFETY MO (MISCELLANEOUS) ×1 IMPLANT
NS IRRIG 1000ML POUR BTL (IV SOLUTION) ×1 IMPLANT
PACK HIP COMPR (MISCELLANEOUS) ×1 IMPLANT
REAMER SHAFT BIXCUT (INSTRUMENTS) IMPLANT
SCREW LAG GAMMA 3 TI 10.5X100M (Screw) IMPLANT
SCREW LOCKING T2 F/T 5MMX45MM (Screw) IMPLANT
SCREW LOCKING THREADED 5X47.5 (Screw) IMPLANT
STAPLER SKIN PROX 35W (STAPLE) ×1 IMPLANT
SUT VIC AB 2-0 CT2 27 (SUTURE) ×1 IMPLANT
SYR 10ML LL (SYRINGE) ×1 IMPLANT
SYR 30ML LL (SYRINGE) ×1 IMPLANT
TAPE CLOTH 3X10 WHT NS LF (GAUZE/BANDAGES/DRESSINGS) ×2 IMPLANT
TRAP FLUID SMOKE EVACUATOR (MISCELLANEOUS) ×1 IMPLANT
WATER STERILE IRR 500ML POUR (IV SOLUTION) ×1 IMPLANT

## 2023-02-04 NOTE — Consult Note (Signed)
Ortho consult note  Chief complaint right hip fracture  History of present illness  The patient has sustained a ground-level fall after having a misstep and falling onto the right hip.  She went to the ER.  X-rays confirmed a intertrochanteric right hip fracture.  Orthopedics was consulted for surgical consideration.  Patient is 82 years old who lives at home and gets around with a walker.  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 Husband and daughter at bedside during the encounter.  They gave the majority of the history.  Patient was responsive but somewhat sleepy.  Past Medical History:  Diagnosis Date   History of ischemic stroke         No family history on file.   No Known Allergies   No current facility-administered medications on file prior to encounter.   No current outpatient medications on file prior to encounter.     Physical exam:     02/04/2023    8:03 AM 02/04/2023    5:52 AM 02/04/2023    5:20 AM  Vitals with BMI  Systolic 130 169   Diastolic 56 50   Pulse 78 70 70     Physical Activity: Not on file  Right Hip Exam   Tenderness  The patient is experiencing tenderness in the anterior, greater trochanter, posterior and lateral.  Range of Motion  Abduction:  abnormal  Adduction:  abnormal  Extension:  abnormal  Flexion:  abnormal  External rotation:  abnormal  Internal rotation:  abnormal   Muscle Strength  Abduction: 2/5  Adduction: 2/5  Flexion: 2/5   Other  Erythema: absent Scars: absent Sensation: normal Pulse: present  Comments:  Pain with axial loading Unable to do straight leg raise Pain with internal/external rotation of the hip  Skin intact throughout Sensation intact to light touch toes        Imaging: No results found for this or any previous visit from the past 60 days.   Reviewed imaging.  Displaced right intertrochanteric hip fracture with  nondisplaced right superior rami fracture.  Assessment and plan  Right intertrochanteric hip fracture.  Right superior rami fracture.  Met with the patient and her husband and their daughter we discussed the diagnosis and the treatment strategy the prognosis the recovery and all the elements involving inpatient and outpatient care and management of right hip intertrochanteric fracture.  I explained that this is a high risk injury and there is associated comorbidities and there are significant risks that are associated with increased morbidity and even increased mortality.  These risks include both consideration for nonoperative and operative strategies.  Prolonged bedrest immobilization is at very high risk scenario which were trying to avoid in this patient by considering surgical intervention.  Because she does get out of bed and walk with a walker surgical consideration for stabilization is standard of care and the recommended treatment for this patient.  There are surgical risks such as bleeding and infection and blood clots and DVT and PE and damage nearby structures and healing problems of bone or soft tissue and pain after surgery and decreased motion after surgery and overall physical deconditioning.  Any of these complications can be a serious complication and can increase mortality and even risk of morbidity.  I discussed this with the patient and the family.  We reviewed and compared and contrasted the surgical strategy to the nonoperative measures.  They asked appropriate questions to  demonstrate their understanding of the conversation.  They understand this is a high risk injury.  The recovery takes about a year to come back from this and the goal was to get her back to the preinjury level prior to the hip fracture.  They would like to proceed with surgery.  Husband signs or medical documents and consent was obtained from him for surgical stabilization and also consent was obtained for blood  transfusion as needed.  We are on-call to the OR.  Thank you  Nada Boozer, ortho

## 2023-02-04 NOTE — Transfer of Care (Signed)
Immediate Anesthesia Transfer of Care Note  Patient: Holly Hubbard  Procedure(s) Performed: INTRAMEDULLARY (IM) NAIL INTERTROCHANTERIC (Right)  Patient Location: PACU  Anesthesia Type:General  Level of Consciousness: drowsy  Airway & Oxygen Therapy: Patient Spontanous Breathing and Patient connected to face mask oxygen  Post-op Assessment: Report given to RN and Post -op Vital signs reviewed and stable  Post vital signs: Reviewed and stable  Last Vitals:  Vitals Value Taken Time  BP 125/55   Temp    Pulse 74   Resp 15   SpO2 100     Last Pain:  Vitals:   02/04/23 0932  TempSrc:   PainSc: 7          Complications: No notable events documented.

## 2023-02-04 NOTE — Assessment & Plan Note (Signed)
Sliding scale insulin coverage 

## 2023-02-04 NOTE — ED Triage Notes (Signed)
Pt BIB [facility] for fall from standing while getting out of bed and landed on her buttocks. Shortening and rotation noted to rt leg. Thinners? -LOC, - head strike. VSS per EMS, binder in place.

## 2023-02-04 NOTE — Assessment & Plan Note (Signed)
Had recent Mini-Mental test by neurologist in May and was referred to [facility] memory disorders clinic Delirium precautions

## 2023-02-04 NOTE — Progress Notes (Addendum)
Ortho PN  Surgery completed.  Right hip fracture stabilization.  EBL 100.  Surgery went well.  No complications.  Postoperative orders placed for: PT weightbearing as tolerated right lower extremity OT weightbearing as tolerated bilateral upper and lower extremities SCDs Postoperative x-rays Operative antibiotics  I usually hold the restarting of chemical DVT prophylaxis (I.e. lovenox) until 24 hours after the completion of surgery.  That would be 5 PM on August 4.  Thank you.  Naiyah Klostermann, ortho

## 2023-02-04 NOTE — H&P (Signed)
History and Physical    Patient: Holly Hubbard MWN:[id number] DOB: 1941/01/20 DOA: 02/04/2023 DOS: the patient was seen and examined on 02/04/2023 PCP: Ardyth Gal, MD  Patient coming from: Home  Chief Complaint:  Chief Complaint  Patient presents with   Fall    HPI: Holly Hubbard is a 82 y.o. female with medical history significant for DM, HTN, cognitive deficits, ischemic stroke October 2023 in [place], with residual right-sided weakness, followed by neurology at [facility] who presents to the ED with a right hip fracture.  Most of the history is given by the husband at the bedside who stated that patient had been having loose stool related to an increase in her metformin dose and has been getting up to use the bathroom at night.  On the night of arrival she was maneuvering her walker to the bathroom when she misstepped falling onto the right hip.  She was previously in her usual state of health. ED course and data review: BP 187/73 with otherwise normal vitals labs notable for WBC of 12,000, hemoglobin of 10, creatinine 1.06 EKG pending Chest x-ray nonacute X-ray hip shows acute intertrochanteric fracture of the right hip with superior subluxation along with minimally displaced fractures of the right superior and inferior pubic rami. The ED provider spoke with orthopedist Dr Allie Bossier who would like to take patient to the OR later in the afternoon Patient was treated with fentanyl Hospitalist consulted for admission.      Social History:  has no history on file for tobacco use, alcohol use, and drug use.  No Known Allergies  No family history on file.  Prior to Admission medications   Not on File    Physical Exam: Vitals:   02/04/23 0308 02/04/23 0310  BP: (!) 187/73   Pulse: 71   Temp: 97.7 F (36.5 C)   TempSrc: Oral   SpO2: 100%   Weight:  58.5 kg  Height:  5\' 6"  (1.676 m)   Physical Exam Vitals and nursing note reviewed.  Constitutional:      General: She  is not in acute distress. HENT:     Head: Normocephalic and atraumatic.  Cardiovascular:     Rate and Rhythm: Normal rate and regular rhythm.     Heart sounds: Normal heart sounds.  Pulmonary:     Effort: Pulmonary effort is normal.     Breath sounds: Normal breath sounds.  Abdominal:     Palpations: Abdomen is soft.     Tenderness: There is no abdominal tenderness.  Neurological:     Mental Status: Mental status is at baseline.     Labs on Admission: I have personally reviewed following labs and imaging studies  CBC: Recent Labs  Lab 02/04/23 0316  WBC 12.1*  NEUTROABS 6.1  HGB 10.1*  HCT 31.7*  MCV 85.0  PLT 233   Basic Metabolic Panel: Recent Labs  Lab 02/04/23 0316  NA 137  K 3.8  CL 103  CO2 22  GLUCOSE 157*  BUN 20  CREATININE 1.06*  CALCIUM 9.6   GFR: Estimated Creatinine Clearance: 38.4 mL/min (A) (by C-G formula based on SCr of 1.06 mg/dL (H)). Liver Function Tests: No results for input(s): "AST", "ALT", "ALKPHOS", "BILITOT", "PROT", "ALBUMIN" in the last 168 hours. No results for input(s): "LIPASE", "AMYLASE" in the last 168 hours. No results for input(s): "AMMONIA" in the last 168 hours. Coagulation Profile: Recent Labs  Lab 02/04/23 0316  INR 1.1   Cardiac Enzymes: No results for input(s): "  CKTOTAL", "CKMB", "CKMBINDEX", "TROPONINI" in the last 168 hours. BNP (last 3 results) No results for input(s): "PROBNP" in the last 8760 hours. HbA1C: No results for input(s): "HGBA1C" in the last 72 hours. CBG: No results for input(s): "GLUCAP" in the last 168 hours. Lipid Profile: No results for input(s): "CHOL", "HDL", "LDLCALC", "TRIG", "CHOLHDL", "LDLDIRECT" in the last 72 hours. Thyroid Function Tests: No results for input(s): "TSH", "T4TOTAL", "FREET4", "T3FREE", "THYROIDAB" in the last 72 hours. Anemia Panel: No results for input(s): "VITAMINB12", "FOLATE", "FERRITIN", "TIBC", "IRON", "RETICCTPCT" in the last 72 hours. Urine analysis: No  results found for: "COLORURINE", "APPEARANCEUR", "LABSPEC", "PHURINE", "GLUCOSEU", "HGBUR", "BILIRUBINUR", "KETONESUR", "PROTEINUR", "UROBILINOGEN", "NITRITE", "LEUKOCYTESUR"  Radiological Exams on Admission: DG Chest Portable 1 View  Result Date: 02/04/2023 CLINICAL DATA:  Initial evaluation for acute trauma, fall, hip fracture. EXAM: PORTABLE CHEST 1 VIEW COMPARISON:  None Available. FINDINGS: Cardiomegaly. Mediastinal silhouette within normal limits. Aortic atherosclerosis. Lungs normally inflated. Mild chronic coarsening of the interstitial markings. Linear atelectasis versus scarring noted at the left lung base. Small calcified granuloma noted at the right lung base. No focal infiltrates. No edema or effusion. No pneumothorax. No acute osseous finding.  Osteopenia noted. IMPRESSION: 1. No radiographic evidence for active cardiopulmonary disease. 2. Cardiomegaly without edema. Aortic Atherosclerosis (ICD10-I70.0). Electronically Signed   By: Rise Mu M.D.   On: 02/04/2023 03:54   DG Hip Unilat W or Wo Pelvis 2-3 Views Right  Result Date: 02/04/2023 CLINICAL DATA:  Initial evaluation for acute trauma, fall, hip pain. EXAM: DG HIP (WITH OR WITHOUT PELVIS) 2-3V RIGHT COMPARISON:  None Available. FINDINGS: Acute fracture involving the inter trochanteric right hip with superior subluxation. Femoral head remains in normal alignment with the acetabulum. There are associated acute minimally displaced fractures of the right superior and inferior pubic rami. Remainder of the bony pelvis otherwise intact. No abnormality about the contralateral left hip. Underlying osteopenia. Lower lumbar spondylosis noted. Scattered vascular calcifications noted. No other visible soft tissue abnormality. IMPRESSION: 1. Acute intertrochanteric fracture of the right hip with superior subluxation. 2. Acute minimally displaced fractures of the right superior and inferior pubic rami. Electronically Signed   By: Rise Mu M.D.   On: 02/04/2023 03:52     Data Reviewed: Relevant notes from primary care and specialist visits, past discharge summaries as available in EHR, including Care Everywhere. Prior diagnostic testing as pertinent to current admission diagnoses Updated medications and problem lists for reconciliation ED course, including vitals, labs, imaging, treatment and response to treatment Triage notes, nursing and pharmacy notes and ED provider's notes Notable results as noted in HPI   Assessment and Plan: * Intertrochanteric fracture of right femur, closed, initial encounter (HCC) Closed right superior and inferior pelvic rami fracture Accidental fall Preoperative clearance Pain control Orthopedic consulted for further recommendations-Per ED provider plans to take patient to the OR later today Hold Plavix Keep n.p.o. 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     DVT prophylaxis: SCD  Consults: Dr Allie Bossier  Advance  Care Planning: full code  Family Communication: Husband at bedside  Disposition Plan: Back to previous home environment  Severity of Illness: The appropriate patient status for this patient is INPATIENT. Inpatient status is judged to be reasonable and necessary in order to provide the required intensity of service to ensure the patient's  safety. The patient's presenting symptoms, physical exam findings, and initial radiographic and laboratory data in the context of their chronic comorbidities is felt to place them at high risk for further clinical deterioration. Furthermore, it is not anticipated that the patient will be medically stable for discharge from the hospital within 2 midnights of admission.   * I certify that at the point of admission it is my clinical judgment that the patient will require inpatient hospital care spanning beyond 2 midnights from the point of admission due to high intensity of service, high risk for further deterioration and high frequency of surveillance required.*  Author: Andris Baumann, MD 02/04/2023 5:05 AM  For on call review [email or web address].

## 2023-02-04 NOTE — Op Note (Signed)
02/04/2023  4:32 PM  PATIENT:  Holly Hubbard  82 y.o. female  PRE-OPERATIVE DIAGNOSIS:  Right hip fracture  POST-OPERATIVE DIAGNOSIS:  Right hip fracture  PROCEDURE:  Procedure(s): INTRAMEDULLARY (IM) NAIL INTERTROCHANTERIC (Right)  Implants, [employer]ryker, long gamma nail  SURGEON:  Surgeons and Role:    * Carlos Heber, Adelfa Koh, MD - Primary  PHYSICIAN ASSISTANT:   ASSISTANTS: none   ANESTHESIA:   general  EBL:  100 mL   BLOOD ADMINISTERED:none  DRAINS: none   LOCAL MEDICATIONS USED:  BUPIVICAINE and Exparel  SPECIMEN:  No Specimen  DISPOSITION OF SPECIMEN:  N/A  COUNTS:  YES  TOURNIQUET:  * No tourniquets in log *  DICTATION: .Dragon Dictation  PLAN OF CARE: Admit to inpatient   PATIENT DISPOSITION:  PACU - hemodynamically stable.   Delay start of Pharmacological VTE agent (>24hrs) due to surgical blood loss or risk of bleeding: yes  Surgery went well.  No complications  Description of procedure:  The patient was taken back into surgery after fully checked in by the surgical team.  Patient's family was present we had a conversation about the diagnosis the prognosis and treatment strategy and all the risks involved.  Everyone especially the patient and her family understood this is a high risk injury to have an any little problem can lead to increased risk of morbidity and even mortality.  There is no guarantees with surgery.  The goal of surgery is surgical stabilization and early mobilization to prevent bedrest immobilization which can be deadly.  I discussed this with everyone consent was obtained verified and the patient was then taken back into surgery  In surgery a surgical pause was done after the right lower extremity was prepped and draped in the standard sterile fashion.  The patient was positioned on the Hana table.  Antibiotics were given.  Anesthesia was administered.  After the timeout was done, the case began.  An incision was made in line with  the femur proximal to the greater trochanter.  Prior to that we used fluoroscopy to confirmed that we can realign the femur fracture.  We did confirm we can do that and we used fluoroscopy during the case.  Standard fluoroscopic guidance and a technique was used throughout the case.  The incision was made the fascia was opened up and guidewire was placed on the tip of the greater trochanter.  We were happy with our starting point advance the guidewire and confirmed it was in good position and the femur on AP and lateral projections.  The proximal reamer was then used to open up the femur.  A long guidewire was passed down to the knee.  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  That did not change our operative strategy.  We measured for the insertion of the nail.  400 mm nail by 125 degrees x 11 mm diameter was used.  We reamed to a 12.5.  The nail was inserted using standard technique.  The nail was sunk down such that the lag screw would be in good position in the femoral head, aiming for center center position.  We passed our K wire made adjustments and then drilled the over the lateral cortex after we measured for the insertion of the lag screw.  Lag screw was inserted in standard technique.  We compressed at the fracture site.  The setscrew was locked proximally.  We remove the handle and final images in the proximal zone AP and lateral confirm good  placement of the hardware across the fracture site and stable alignment.  Of the onto distal fixation.  Center center position was achieved with fluoroscopy.  Incision was made in the lateral femur.  The fascia was split we drilled across measured for the insertion of 2 screws.  Distal final imaging AP and lateral confirmed good placement of the hardware and good positioning of the screws.  At this point we achieved our final x-rays.  We copiously irrigated all of the wounds.  We injected bupivacaine and Exparel in all the wounds.  TXA was  injected in the wounds.  The wounds were closed in layers.  Strata fix was used in the deep layer and at the subcutaneous layer in the dermis and 3-0 running Monocryl was used at the surface.  Xeroform was placed and Mepilex bandages were placed.  Patient tolerated the procedure well.  There were no complications.   She was taken to the recovery room in stable condition.   Disposition: Readmitted back to the hospitalist team.  Orders will be placed for therapy and postoperative x-rays and postoperative antibiotics. The patient is weightbearing as tolerated. Dispo is likely to subacute rehab facility vs. Home depending on how she does with PT.

## 2023-02-04 NOTE — Assessment & Plan Note (Signed)
Blood pressure elevated at 187/73 Pain control Hydralazine IV as needed while n.p.o.

## 2023-02-04 NOTE — Anesthesia Preprocedure Evaluation (Addendum)
Anesthesia Evaluation  Patient identified by MRN, date of birth, ID band Patient awake    Reviewed: Allergy & Precautions, H&P , NPO status , Patient's Chart, lab work & pertinent test results  Airway Mallampati: III  TM Distance: >3 FB Neck ROM: Full    Dental   Bridge upper teeth, does not come out:   Pulmonary neg pulmonary ROS   Pulmonary exam normal breath sounds clear to auscultation       Cardiovascular hypertension, Normal cardiovascular exam Rhythm:Regular Rate:Normal  Exam Date:     11/21/2022 1:32 PM Admit Date:     11/21/2022  Exam Type:     ECHOCARDIOGRAM W COLORFLOW SPECTRAL DOPPLER  Technical Quality:     Fair  Staff Sonographer:     Carolin Coy Referring Physician:     De Blanch  Study Info Indications     - ischemic stroke    I63.9 - Ischemic stroke (CMS-HCC)    I63.89 - Other cerebral infarction (CMS-HCC) Procedure(s)  Complete two-dimensional, color flow and Doppler transthoracic echocardiogram is performed.   Summary  1. The left ventricle is normal in size with normal wall thickness.  2. The left ventricular systolic function is normal, LVEF is visually estimated at > 55%.  3. Mitral annular calcification is present.  4. The aortic valve is poorly visualized but appears moderately thickened with reduced excursion.  5. The left atrium is mildly dilated in size.  6. The right ventricle is normal in size, with normal systolic function.   Left Ventricle  The left ventricle is normal in size with normal wall thickness. The left ventricular systolic function is normal, LVEF is visually estimated at > 55%. The left ventricular diastolic function is indeterminate.  Right Ventricle  The right ventricle is normal in size, with normal systolic function.   Left Atrium  The left atrium is mildly dilated in size.  Right Atrium  The right atrium is normal in size.   Aortic Valve  The  aortic valve is poorly visualized but appears moderately thickened with reduced excursion. There is trivial aortic regurgitation. There is no evidence of a significant transvalvular gradient.  Mitral Valve  The mitral valve leaflets are mildly thickened with normal leaflet mobility. Mitral annular calcification is present. There is no significant mitral valve regurgitation.  Tricuspid Valve  The tricuspid valve leaflets are poorly visualized but probably normal, with normal leaflet mobility. There is trivial tricuspid regurgitation. There is no pulmonary hypertension. TR maximum velocity: 2.2 m/s  Estimated PASP: 22 mmHg.  Pulmonic Valve  Pulmonary valve is not well visualized. There is no significant pulmonic regurgitation. There is no evidence of a significant transvalvular gradient.     Neuro/Psych 04-13-22 ischemic stroke   negative neurological ROS  negative psych ROS   GI/Hepatic negative GI ROS, Neg liver ROS,,,  Endo/Other  diabetes, Type 2, Insulin Dependent    Renal/GU negative Renal ROS  negative genitourinary   Musculoskeletal negative musculoskeletal ROS (+)    Abdominal   Peds negative pediatric ROS (+)  Hematology negative hematology ROS (+)   Anesthesia Other Findings Na 137, K 3.8, cl 103, C02 22, BUN 20, INR 1.06, glucose 157  WBC 12.1, hgb 10.1, hct 31.7, plat 233  Last plavix yesterday  Reproductive/Obstetrics negative OB ROS                             Anesthesia Physical Anesthesia Plan  ASA: 3  Anesthesia Plan: General ETT   Post-op Pain Management:    Induction: Intravenous  PONV Risk Score and Plan:   Airway Management Planned: Oral ETT  Additional Equipment:   Intra-op Plan:   Post-operative Plan: Extubation in OR  Informed Consent: I have reviewed the patients History and Physical, chart, labs and discussed the procedure including the risks, benefits and alternatives for the proposed anesthesia  with the patient or authorized representative who has indicated his/her understanding and acceptance.     Dental Advisory Given  Plan Discussed with: Anesthesiologist, CRNA and Surgeon  Anesthesia Plan Comments: (Patient consented for risks of anesthesia including but not limited to:  - adverse reactions to medications - damage to eyes, teeth, lips or other oral mucosa - nerve damage due to positioning  - sore throat or hoarseness - Damage to heart, brain, nerves, lungs, other parts of body or loss of life  Patient voiced understanding.)       Anesthesia Quick Evaluation

## 2023-02-04 NOTE — Progress Notes (Signed)
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  She got up from bed and walked to the bathroom with her walker and tripped and fell to her right side.  No LOC, chest pain, shortness of breath, confusion.  Event was witnessed by her husband.  Assessment and Plan:  Right Hip Fracture secondary to mechanical fall s/p 8/3 ORIF: - PT/OT consult is ordered. - Continue Lovenox for DVT prophylaxis.  Dehydration:  - Start gentle fluids.  Chronic Dementia with behavioral disturbances: Mental status is at baseline. - Continue home medications. - Follow up with regular [facility] Neurologist Dr. Regino Schultze.  2023 left pontine stroke: - Restart Plavix after surgery. - Continue Statin.  Diabetes Mellitus Type 2: A1C is 7.3%. Glucose is stable. - Continue sliding scale. - Patient had diarrhea on Metformin and family requests alternative medication.  We will refer patient to endocrinologist on discharge to discuss alternative diabetes medications.    Essential Hypertension: BP is stable. - Continue home medications.  Cognitive Dysfunction: - Follow up with [facility].  Chronic Constipation: - Colace as needed.  Diet: Cardiac DVT: Lovenox Dispo: Floors Discharge Plan: PT/OT is consulted for evaluation after surgery.  Patient lives at home with husband, daughter, and granddaughter.  She has a rolling walker.   Subjective:  Holly Hubbard is resting comfortably. Dilaudid controls her pain. She denies any  chest pain or shortness of breath.  Physical Exam: Vitals:   02/04/23 1700 02/04/23 1715 02/04/23 1730 02/04/23 1745  BP: 122/60 (!) 128/57 (!) 115/53 (!) 130/47  Pulse: 80 81 69 72  Resp: 17  16   Temp:   97.7 F (36.5 C)   TempSrc:      SpO2: 98% 92% 91% 91%  Weight:      Height:       Physical Exam Constitutional:      General: She is in acute distress.     Comments: Chronically ill appearing Right hip pain  HENT:     Head: Normocephalic and atraumatic.     Mouth/Throat:     Mouth: Mucous membranes are dry.  Eyes:     Extraocular Movements: Extraocular movements intact.     Pupils: Pupils are equal, round, and reactive to light.  Cardiovascular:     Rate and Rhythm: Normal rate and regular rhythm.     Pulses: Normal pulses.     Heart sounds: Normal heart sounds.  Pulmonary:     Effort: Pulmonary effort is normal.     Breath sounds: Normal breath sounds.  Abdominal:     General: There is no distension.     Palpations: Abdomen is soft.  Musculoskeletal:     Cervical back: Neck supple.  Skin:    General: Skin is warm and dry.     Capillary Refill: Capillary refill takes less than 2 seconds.  Neurological:     Mental Status: Mental status is at baseline.     Comments: Cognitive impairment, poor memory Residual right sided weakness  Psychiatric:  Mood and Affect: Mood normal.      Data Reviewed: Lab Results  Component Value Date   WBC 12.1 (H) 02/04/2023   HGB 10.1 (L) 02/04/2023   HCT 31.7 (L) 02/04/2023   MCV 85.0 02/04/2023   PLT 233 02/04/2023   Last metabolic panel Lab Results  Component Value Date   GLUCOSE 157 (H) 02/04/2023   NA 137 02/04/2023   K 3.8 02/04/2023   CL 103 02/04/2023   CO2 22 02/04/2023   BUN 20 02/04/2023   CREATININE 1.06 (H) 02/04/2023   GFRNONAA 53 (L) 02/04/2023   CALCIUM 9.6 02/04/2023   ANIONGAP 12 02/04/2023    Family Communication: Plan of care discussed with family at bedside.  Disposition: Status is:  Inpatient Remains inpatient appropriate because: Right Hip ORIF.  PT/OT evaluation pending.  Planned Discharge Destination: Home    Time spent: >60 minutes  Author: Baldwin Jamaica, MD 02/04/2023 6:12 PM  For on call review [email or web address].

## 2023-02-04 NOTE — Progress Notes (Signed)
Planning on Right hip fx surgery this am. Please keep npo  Will be by to see patient.   Thank you   Megann Easterwood ortho

## 2023-02-04 NOTE — Anesthesia Postprocedure Evaluation (Signed)
Anesthesia Post Note  Patient: Holly Hubbard  Procedure(s) Performed: INTRAMEDULLARY (IM) NAIL INTERTROCHANTERIC (Right)  Patient location during evaluation: PACU Anesthesia Type: General Level of consciousness: awake and alert Pain management: pain level controlled Vital Signs Assessment: post-procedure vital signs reviewed and stable Respiratory status: spontaneous breathing, nonlabored ventilation, respiratory function stable and patient connected to nasal cannula oxygen Cardiovascular status: blood pressure returned to baseline and stable Postop Assessment: no apparent nausea or vomiting Anesthetic complications: no   No notable events documented.   Last Vitals:  Vitals:   02/04/23 1745 02/04/23 1830  BP: (!) 130/47 (!) 115/56  Pulse: 72 (!) 59  Resp:  18  Temp:    SpO2: 91% 100%    Last Pain:  Vitals:   02/04/23 2053  TempSrc:   PainSc: 0-No pain                 Ellery Meroney C Teryl Mcconaghy

## 2023-02-04 NOTE — Anesthesia Procedure Notes (Signed)
Procedure Name: Intubation Date/Time: 02/04/2023 2:46 PM  Performed by: Karoline Caldwell, CRNAPre-anesthesia Checklist: Patient identified, Patient being monitored, Timeout performed, Emergency Drugs available and Suction available Patient Re-evaluated:Patient Re-evaluated prior to induction Oxygen Delivery Method: Circle system utilized Preoxygenation: Pre-oxygenation with 100% oxygen Induction Type: IV induction Ventilation: Mask ventilation without difficulty Laryngoscope Size: 3 and McGraph Grade View: Grade I Tube type: Oral Tube size: 6.5 mm Number of attempts: 1 Airway Equipment and Method: Stylet Placement Confirmation: ETT inserted through vocal cords under direct vision, positive ETCO2 and breath sounds checked- equal and bilateral Secured at: 20 cm Tube secured with: Tape Dental Injury: Teeth and Oropharynx as per pre-operative assessment

## 2023-02-04 NOTE — Plan of Care (Signed)

## 2023-02-04 NOTE — Progress Notes (Signed)
Initial Nutrition Assessment  DOCUMENTATION CODES:   Not applicable  INTERVENTION:  - NPO for OR.   - Once diet advanced, recommend Ensure Plus High Protein po BID. Each supplement provides 350 kcal and 20 grams of protein.  - Multivitamin with minerals daily  - Monitor weight trends.   NUTRITION DIAGNOSIS:   Increased nutrient needs related to acute illness (right hip fracture) as evidenced by estimated needs.  GOAL:   Patient will meet greater than or equal to 90% of their needs  MONITOR:   PO intake, Supplement acceptance, Diet advancement, Weight trends  REASON FOR ASSESSMENT:   Consult Hip fracture protocol  ASSESSMENT:   82 y.o. female with PMH significant for DM, HTN, cognitive deficits, ischemic stroke October 2023 with residual right-sided weakness who presented with a right hip fracture.   Patient in OR this afternoon for hip fracture.   Per EMR, weight history over the past year via chart review as below: 07/11/22: 131# 2/12: 130# 4/1: 131# 5/15: 130# 7/1: 126# 8/1: 129# 8/3 (admit weight): 129# No significant changes in weight noted.   Patient currently NPO for OR. Recommend Ensure supplements added once diet advanced to support intake.     Medications reviewed and include: Insulin  Labs reviewed:  Creatinine 1.06   NUTRITION - FOCUSED PHYSICAL EXAM:  RD working remotely  Diet Order:   Diet Order             Diet NPO time specified  Diet effective now                   EDUCATION NEEDS:  Not appropriate for education at this time  Skin:  Skin Assessment: Skin Integrity Issues: Skin Integrity Issues:: Incisions Incisions: Right leg  Last BM:  8/3  Height:  Ht Readings from Last 1 Encounters:  02/04/23 5\' 6"  (1.676 m)   Weight:  Wt Readings from Last 1 Encounters:  02/04/23 58.5 kg    BMI:  Body mass index is 20.82 kg/m.  Estimated Nutritional Needs:  Kcal:  1650-1750 kcals Protein:  70-90 grams Fluid:  >/=  1.6L    Shelle Iron RD, LDN For contact information, refer to [facility].

## 2023-02-04 NOTE — Brief Op Note (Signed)
02/04/2023  4:32 PM  PATIENT:  Holly Hubbard  82 y.o. female  PRE-OPERATIVE DIAGNOSIS:  Right hip fracture  POST-OPERATIVE DIAGNOSIS:  Right hip fracture  PROCEDURE:  Procedure(s): INTRAMEDULLARY (IM) NAIL INTERTROCHANTERIC (Right)  Implants, Stryker, long gamma nail  SURGEON:  Surgeons and Role:    * Maxene Byington, Adelfa Koh, MD - Primary  PHYSICIAN ASSISTANT:   ASSISTANTS: none   ANESTHESIA:   general  EBL:  100 mL   BLOOD ADMINISTERED:none  DRAINS: none   LOCAL MEDICATIONS USED:  BUPIVICAINE and Exparel  SPECIMEN:  No Specimen  DISPOSITION OF SPECIMEN:  N/A  COUNTS:  YES  TOURNIQUET:  * No tourniquets in log *  DICTATION: .Dragon Dictation  PLAN OF CARE: Admit to inpatient   PATIENT DISPOSITION:  PACU - hemodynamically stable.   Delay start of Pharmacological VTE agent (>24hrs) due to surgical blood loss or risk of bleeding: yes  Surgery went well.  No complications

## 2023-02-04 NOTE — Assessment & Plan Note (Addendum)
Closed right superior and inferior pelvic rami fracture Accidental fall Preoperative clearance Pain control Orthopedic consulted for further recommendations-Per ED provider plans to take patient to the OR later today Hold Plavix Keep n.p.o. Patient at low risk for perioperative cardiopulmonary complications Patient had echocardiogram 11/21/2022 that showed EF greater than 55%, no significant valvulopathy noted

## 2023-02-04 NOTE — ED Provider Notes (Signed)
[facility] Provider Note    Event Date/Time   First MD Initiated Contact with Patient 02/04/23 [phone]     (approximate)   History   Fall   HPI  Holly Hubbard is a 82 y.o. female who presents to the ED for evaluation of Fall   Patient presents to the ED after a fall at home with right leg pain.  Reports that she was getting up out of bed to void when she fell back onto her bottom.  Did not strike her head or experience syncope.  She reports right proximal leg pain.   Physical Exam   Triage Vital Signs: ED Triage Vitals  Encounter Vitals Group     BP 02/04/23 0308 (!) 187/73     Systolic BP Percentile --      Diastolic BP Percentile --      Pulse Rate 02/04/23 0308 71     Resp --      Temp 02/04/23 0308 97.7 F (36.5 C)     Temp Source 02/04/23 0308 Oral     SpO2 02/04/23 0308 100 %     Weight 02/04/23 0310 129 lb (58.5 kg)     Height 02/04/23 0310 5\' 6"  (1.676 m)     Head Circumference --      Peak Flow --      Pain Score 02/04/23 0308 10     Pain Loc --      Pain Education --      Exclude from Growth Chart --     Most recent vital signs: Vitals:   02/04/23 0308  BP: (!) 187/73  Pulse: 71  Temp: 97.7 F (36.5 C)  SpO2: 100%    General: Awake, no distress.  CV:  Good peripheral perfusion.  Resp:  Normal effort.  Abd:  No distention.  MSK:  Shortened and rotated right lower extremity without evidence of open injury.  Foot is neurovascularly intact. Palpation of other 3 extremities otherwise without evidence of deformity, tenderness or trauma Neuro:  No focal deficits appreciated. Other:     ED Results / Procedures / Treatments   Labs (all labs ordered are listed, but only abnormal results are displayed) Labs Reviewed  CBC WITH DIFFERENTIAL/PLATELET - Abnormal; Notable for the following components:      Result Value   WBC 12.1 (*)    RBC 3.73 (*)    Hemoglobin 10.1 (*)    HCT 31.7 (*)    RDW 16.4 (*)    Lymphs Abs  4.8 (*)    Abs Immature Granulocytes 0.08 (*)    All other components within normal limits  BASIC METABOLIC PANEL - Abnormal; Notable for the following components:   Glucose, Bld 157 (*)    Creatinine, Ser 1.06 (*)    GFR, Estimated 53 (*)    All other components within normal limits  PROTIME-INR  TYPE AND SCREEN  TYPE AND SCREEN    EKG   RADIOLOGY CXR interpreted by me without evidence of acute cardiopulmonary pathology. Plain film of the pelvis and right hip interpreted by me with right intertrochanteric femur fracture, pubic rami fractures  Official radiology report(s): DG Chest Portable 1 View  Result Date: 02/04/2023 CLINICAL DATA:  Initial evaluation for acute trauma, fall, hip fracture. EXAM: PORTABLE CHEST 1 VIEW COMPARISON:  None Available. FINDINGS: Cardiomegaly. Mediastinal silhouette within normal limits. Aortic atherosclerosis. Lungs normally inflated. Mild chronic coarsening of the interstitial markings. Linear atelectasis versus scarring noted at the left  lung base. Small calcified granuloma noted at the right lung base. No focal infiltrates. No edema or effusion. No pneumothorax. No acute osseous finding.  Osteopenia noted. IMPRESSION: 1. No radiographic evidence for active cardiopulmonary disease. 2. Cardiomegaly without edema. Aortic Atherosclerosis (ICD10-I70.0). Electronically Signed   By: Rise Mu M.D.   On: 02/04/2023 03:54   DG Hip Unilat W or Wo Pelvis 2-3 Views Right  Result Date: 02/04/2023 CLINICAL DATA:  Initial evaluation for acute trauma, fall, hip pain. EXAM: DG HIP (WITH OR WITHOUT PELVIS) 2-3V RIGHT COMPARISON:  None Available. FINDINGS: Acute fracture involving the inter trochanteric right hip with superior subluxation. Femoral head remains in normal alignment with the acetabulum. There are associated acute minimally displaced fractures of the right superior and inferior pubic rami. Remainder of the bony pelvis otherwise intact. No abnormality  about the contralateral left hip. Underlying osteopenia. Lower lumbar spondylosis noted. Scattered vascular calcifications noted. No other visible soft tissue abnormality. IMPRESSION: 1. Acute intertrochanteric fracture of the right hip with superior subluxation. 2. Acute minimally displaced fractures of the right superior and inferior pubic rami. Electronically Signed   By: Rise Mu M.D.   On: 02/04/2023 03:52    PROCEDURES and INTERVENTIONS:  Procedures  Medications  fentaNYL (SUBLIMAZE) injection 50 mcg (50 mcg Intravenous Given 02/04/23 0321)  fentaNYL (SUBLIMAZE) injection 50 mcg (50 mcg Intravenous Given 02/04/23 0415)     IMPRESSION / MDM / ASSESSMENT AND PLAN / ED COURSE  I reviewed the triage vital signs and the nursing notes.  Differential diagnosis includes, but is not limited to, hip dislocation, femur fracture, pelvic fracture, syncope, stroke or seizure  {Patient presents with symptoms of an acute illness or injury that is potentially life-threatening.  Patient presents from home after a fall with an intertrochanteric femur fracture on the right requiring admission for fixation.  No complaints of syncope, dizziness or head trauma.  X-rays of her pelvis and right hip, as above.  Screening blood work is fairly benign with a reassuring metabolic panel.  Marginal leukocytosis but no preceding infectious symptoms.  Will consult with medicine for admission.  I consulted with orthopedics,Dr. Clemencia Course, who will see in consultation for fixation later today      FINAL CLINICAL IMPRESSION(S) / ED DIAGNOSES   Final diagnoses:  Displaced intertrochanteric fracture of right femur, initial encounter for closed fracture [facility])  Fall, initial encounter     Rx / DC Orders   ED Discharge Orders     None        Note:  This document was prepared using Dragon voice recognition software and may include unintentional dictation errors.   Delton Prairie, MD 02/04/23 [phone]

## 2023-02-04 NOTE — Assessment & Plan Note (Signed)
Hold Plavix due to surgery Continue statin

## 2023-02-05 DIAGNOSIS — S72141A Displaced intertrochanteric fracture of right femur, initial encounter for closed fracture: Secondary | ICD-10-CM | POA: Diagnosis not present

## 2023-02-05 LAB — CBC
HCT: 21.3 % — ABNORMAL LOW (ref 36.0–46.0)
Hemoglobin: 7 g/dL — ABNORMAL LOW (ref 12.0–15.0)
MCH: 27.7 pg (ref 26.0–34.0)
MCHC: 32.9 g/dL (ref 30.0–36.0)
MCV: 84.2 fL (ref 80.0–100.0)
Platelets: 171 10*3/uL (ref 150–400)
RBC: 2.53 MIL/uL — ABNORMAL LOW (ref 3.87–5.11)
RDW: 16.6 % — ABNORMAL HIGH (ref 11.5–15.5)
WBC: 10.4 10*3/uL (ref 4.0–10.5)
nRBC: 0 % (ref 0.0–0.2)

## 2023-02-05 LAB — BASIC METABOLIC PANEL
Anion gap: 11 (ref 5–15)
BUN: 28 mg/dL — ABNORMAL HIGH (ref 8–23)
CO2: 23 mmol/L (ref 22–32)
Calcium: 8.4 mg/dL — ABNORMAL LOW (ref 8.9–10.3)
Chloride: 102 mmol/L (ref 98–111)
Creatinine, Ser: 1.33 mg/dL — ABNORMAL HIGH (ref 0.44–1.00)
GFR, Estimated: 40 mL/min — ABNORMAL LOW (ref >60)
Glucose, Bld: 215 mg/dL — ABNORMAL HIGH (ref 70–99)
Potassium: 4 mmol/L (ref 3.5–5.1)
Sodium: 136 mmol/L (ref 135–145)

## 2023-02-05 LAB — GLUCOSE, CAPILLARY
Glucose-Capillary: 127 mg/dL — ABNORMAL HIGH (ref 70–99)
Glucose-Capillary: 171 mg/dL — ABNORMAL HIGH (ref 70–99)
Glucose-Capillary: 176 mg/dL — ABNORMAL HIGH (ref 70–99)
Glucose-Capillary: 211 mg/dL — ABNORMAL HIGH (ref 70–99)
Glucose-Capillary: 222 mg/dL — ABNORMAL HIGH (ref 70–99)

## 2023-02-05 LAB — BASIC METABOLIC PANEL WITH GFR
Anion gap: 5 (ref 5–15)
BUN: 27 mg/dL — ABNORMAL HIGH (ref 8–23)
CO2: 23 mmol/L (ref 22–32)
Calcium: 8.1 mg/dL — ABNORMAL LOW (ref 8.9–10.3)
Chloride: 106 mmol/L (ref 98–111)
Creatinine, Ser: 1.22 mg/dL — ABNORMAL HIGH (ref 0.44–1.00)
GFR, Estimated: 45 mL/min — ABNORMAL LOW (ref >60)
Glucose, Bld: 235 mg/dL — ABNORMAL HIGH (ref 70–99)
Potassium: 3.8 mmol/L (ref 3.5–5.1)
Sodium: 134 mmol/L — ABNORMAL LOW (ref 135–145)

## 2023-02-05 LAB — ABO/RH: ABO/RH(D): B POS

## 2023-02-05 LAB — HEMOGLOBIN AND HEMATOCRIT, BLOOD
HCT: 26.6 % — ABNORMAL LOW (ref 36.0–46.0)
Hemoglobin: 8.6 g/dL — ABNORMAL LOW (ref 12.0–15.0)

## 2023-02-05 LAB — PREPARE RBC (CROSSMATCH)

## 2023-02-05 MED ORDER — INSULIN ASPART 100 UNIT/ML IJ SOLN
4.0000 [IU] | Freq: Three times a day (TID) | INTRAMUSCULAR | Status: DC
  Filled 2023-02-05: qty 1

## 2023-02-05 MED ORDER — TAMSULOSIN HCL 0.4 MG PO CAPS
0.4000 mg | ORAL_CAPSULE | Freq: Every day | ORAL | Status: DC
  Administered 2023-02-05 – 2023-02-09 (×5): 0.4 mg via ORAL
  Filled 2023-02-05 (×5): qty 1

## 2023-02-05 MED ORDER — POLYETHYLENE GLYCOL 3350 17 G PO PACK
17.0000 g | PACK | Freq: Every day | ORAL | Status: DC | PRN
  Administered 2023-02-05 – 2023-02-08 (×2): 17 g via ORAL
  Filled 2023-02-05 (×3): qty 1

## 2023-02-05 MED ORDER — ATORVASTATIN CALCIUM 80 MG PO TABS
80.0000 mg | ORAL_TABLET | Freq: Every day | ORAL | Status: DC
  Administered 2023-02-05 – 2023-02-09 (×5): 80 mg via ORAL
  Filled 2023-02-05: qty 1
  Filled 2023-02-05 (×5): qty 4
  Filled 2023-02-05: qty 1

## 2023-02-05 MED ORDER — INSULIN ASPART 100 UNIT/ML IJ SOLN: 3.0000 [IU] | Freq: Three times a day (TID) | INTRAMUSCULAR | Status: DC

## 2023-02-05 MED ORDER — DOCUSATE SODIUM 100 MG PO CAPS
100.0000 mg | ORAL_CAPSULE | Freq: Two times a day (BID) | ORAL | Status: DC
  Administered 2023-02-05: 100 mg via ORAL
  Filled 2023-02-05: qty 1

## 2023-02-05 MED ORDER — DOCUSATE SODIUM 100 MG PO CAPS
100.0000 mg | ORAL_CAPSULE | Freq: Two times a day (BID) | ORAL | Status: DC | PRN
  Administered 2023-02-07 – 2023-02-08 (×3): 100 mg via ORAL
  Filled 2023-02-05 (×4): qty 1

## 2023-02-05 MED ORDER — INSULIN ASPART 100 UNIT/ML IJ SOLN: 0.0000 [IU] | Freq: Three times a day (TID) | INTRAMUSCULAR | Status: DC

## 2023-02-05 MED ORDER — PANTOPRAZOLE SODIUM 20 MG PO TBEC
20.0000 mg | DELAYED_RELEASE_TABLET | Freq: Every day | ORAL | Status: DC
  Administered 2023-02-05 – 2023-02-09 (×5): 20 mg via ORAL
  Filled 2023-02-05 (×5): qty 1

## 2023-02-05 MED ORDER — INSULIN ASPART 100 UNIT/ML IJ SOLN
2.0000 [IU] | Freq: Three times a day (TID) | INTRAMUSCULAR | Status: DC
  Administered 2023-02-05: 2 [IU] via SUBCUTANEOUS
  Filled 2023-02-05: qty 1

## 2023-02-05 MED ORDER — LACTATED RINGERS IV SOLN: INTRAVENOUS | Status: AC

## 2023-02-05 MED ORDER — LACTATED RINGERS IV SOLN: INTRAVENOUS | Status: DC

## 2023-02-05 MED ORDER — INSULIN GLARGINE-YFGN 100 UNIT/ML ~~LOC~~ SOLN
5.0000 [IU] | Freq: Two times a day (BID) | SUBCUTANEOUS | Status: DC
  Filled 2023-02-05: qty 0.05

## 2023-02-05 MED ORDER — SODIUM CHLORIDE 0.9% IV SOLUTION: Freq: Once | INTRAVENOUS | Status: AC

## 2023-02-05 MED ORDER — CHLORHEXIDINE GLUCONATE CLOTH 2 % EX PADS
6.0000 | MEDICATED_PAD | Freq: Every day | CUTANEOUS | Status: DC
  Administered 2023-02-05 – 2023-02-09 (×3): 6 via TOPICAL

## 2023-02-05 MED ORDER — INSULIN GLARGINE-YFGN 100 UNIT/ML ~~LOC~~ SOLN
5.0000 [IU] | Freq: Every day | SUBCUTANEOUS | Status: DC
  Administered 2023-02-05: 5 [IU] via SUBCUTANEOUS
  Filled 2023-02-05: qty 0.05

## 2023-02-05 MED ORDER — INSULIN ASPART 100 UNIT/ML IJ SOLN
0.0000 [IU] | Freq: Three times a day (TID) | INTRAMUSCULAR | Status: DC
  Administered 2023-02-06 (×2): 3 [IU] via SUBCUTANEOUS
  Administered 2023-02-06: 2 [IU] via SUBCUTANEOUS
  Administered 2023-02-07: 1 [IU] via SUBCUTANEOUS
  Administered 2023-02-07 (×2): 2 [IU] via SUBCUTANEOUS
  Administered 2023-02-08: 5 [IU] via SUBCUTANEOUS
  Administered 2023-02-08: 2 [IU] via SUBCUTANEOUS
  Administered 2023-02-08: 3 [IU] via SUBCUTANEOUS
  Administered 2023-02-09 (×3): 2 [IU] via SUBCUTANEOUS
  Filled 2023-02-05 (×12): qty 1

## 2023-02-05 MED ORDER — DONEPEZIL HCL 5 MG PO TABS
10.0000 mg | ORAL_TABLET | Freq: Every day | ORAL | Status: DC
  Administered 2023-02-05 – 2023-02-08 (×4): 10 mg via ORAL
  Filled 2023-02-05 (×4): qty 2

## 2023-02-05 NOTE — Progress Notes (Signed)
Ortho PN  Cc: right hip fx  S: no acute events  O: chart reviewed H/H 7/21  Postoperative x-rays reviewed.  Stable fixation.  There are 3 operative sites.  Proximal and distal are clean and dry without any evidence of bleeding or strikethrough.  The middle site has a small amount of spotting on the bandage. The thigh is soft and compressible.  She is moving the ankle foot and toes well.  Distally neurovascular intact   Assessment and plan:  82 year old female postoperative day #1 status post right hip cephalomedullary nail fixation of intertrochanteric fracture.    She has acute blood loss anemia.  Please consider transfusion.  Orthopedic goal for hemoglobin is 8 or greater.  A consent was previously obtained for blood transfusion.  Her postoperative x-rays look good.  She is weightbearing as tolerated right lower extremity.  Please hold chemical DVT prophylaxis in the context of acute blood loss anemia.  Order confirmed for SCDs.  Thank you  Laryn Venning, ortho

## 2023-02-05 NOTE — Progress Notes (Signed)
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  She got up from bed and walked to the bathroom with her walker and tripped and fell to her right side.  No LOC, chest pain, shortness of breath, confusion.  Event was witnessed by her husband.  Assessment and Plan:  Right Hip Fracture secondary to mechanical fall s/p 8/3 ORIF: - Appreciate Orthopedic Surgery assistance and recommendations. - PT/OT consult is pending, appreciate.  Family is requesting discharge home with outpatient physical therapy. - Continue Lovenox for DVT prophylaxis.  Acute Blood Loss Anemia s/p 8/3 ORIF: - Give 1 unit pRBC and monitor HH.  Mild AKI, pre-renal: - Creatinine increased from 1.06 to 1.33 mg/dL. - Start LR x 10 hours and repeat BMP in am.  Chronic Dementia with behavioral disturbances: Mental status is at baseline. - Continue home Donepezil. - Follow up with regular [facility] Neurologist Dr. Regino Schultze.  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 - Continue Statin.  Diabetes Mellitus Type 2: A1C is 7.3%. Glucose is a little elevated. - Start Lantus 5 units BID and Aspart 4 units TID. - Continue sliding scale and monitor POC glucose ACHS. - Patient had diarrhea on Metformin and family requests alternative medication.  We will refer patient to endocrinologist on discharge to discuss alternative diabetes medications.    Essential Hypertension: BP is low normal while on pain medications. - Hold  home Amlodipine and Telmisartan.  Hyperlipidemia: - Statin.  GERD: - PPI.  Cognitive Dysfunction: - Follow up with [facility].  Chronic Constipation: - Colace and Miralax as needed.  Diet: Cardiac DVT: Lovenox Dispo: Floors Discharge Plan: PT/OT evaluation is pending.  Patient lives at home with husband, daughter, and granddaughter.  Husband does not want SNF and requests discharge home with outpatient physical therapy.  Patient has a rolling walker at home.   Subjective:  Ms. Templeman is doing well after surgery. Pain is under control. She is receiving 1 unit of blood. She denies any chest pain or shortness of breath.  Physical Exam: Vitals:   02/05/23 1037 02/05/23 1138 02/05/23 1216 02/05/23 1220  BP: (!) 102/57 109/73 124/60 127/60  Pulse: (!) 45 77 66 64  Resp: 18 18 18    Temp: 98.4 F (36.9 C) 98.6 F (37 C) 97.7 F (36.5 C) 97.7 F (36.5 C)  TempSrc:  Oral Oral   SpO2: 95% 97%  100%  Weight:      Height:       Physical Exam Constitutional:      General: She is in acute distress.     Comments: Chronically ill appearing Right hip pain  HENT:     Head: Normocephalic and atraumatic.     Mouth/Throat:     Mouth: Mucous membranes are dry.  Eyes:     Extraocular Movements: Extraocular movements intact.     Pupils: Pupils are equal, round, and reactive to light.  Cardiovascular:     Rate and Rhythm: Normal rate and regular rhythm.  Pulses: Normal pulses.     Heart sounds: Normal heart sounds.  Pulmonary:     Effort: Pulmonary effort is normal.     Breath sounds: Normal breath sounds.  Abdominal:     General: There is no distension.     Palpations: Abdomen is soft.  Musculoskeletal:     Cervical back: Neck supple.  Skin:    General: Skin is warm and dry.     Capillary Refill: Capillary refill takes less than 2 seconds.  Neurological:     Mental Status: Mental status is at baseline.     Comments: Cognitive impairment,  poor memory Residual right sided weakness  Psychiatric:        Mood and Affect: Mood normal.      Data Reviewed: Lab Results  Component Value Date   WBC 10.4 02/05/2023   HGB 7.0 (L) 02/05/2023   HCT 21.3 (L) 02/05/2023   MCV 84.2 02/05/2023   PLT 171 02/05/2023   Last metabolic panel Lab Results  Component Value Date   GLUCOSE 215 (H) 02/05/2023   NA 136 02/05/2023   K 4.0 02/05/2023   CL 102 02/05/2023   CO2 23 02/05/2023   BUN 28 (H) 02/05/2023   CREATININE 1.33 (H) 02/05/2023   GFRNONAA 40 (L) 02/05/2023   CALCIUM 8.4 (L) 02/05/2023   ANIONGAP 11 02/05/2023    Family Communication: Plan of care discussed with family at bedside.  Disposition: Status is: Inpatient Remains inpatient appropriate because: Right Hip ORIF.  PT/OT evaluation pending.  Planned Discharge Destination: Home    Time spent: >60 minutes  Author: Baldwin Jamaica, MD 02/05/2023 2:12 PM  For on call review [email or web address].

## 2023-02-05 NOTE — Plan of Care (Signed)
  Problem: Nutritional: Goal: Maintenance of adequate nutrition will improve Outcome: Progressing   Problem: Skin Integrity: Goal: Risk for impaired skin integrity will decrease Outcome: Progressing   Problem: Tissue Perfusion: Goal: Adequacy of tissue perfusion will improve Outcome: Progressing   Problem: Clinical Measurements: Goal: Will remain free from infection Outcome: Progressing   Problem: Nutrition: Goal: Adequate nutrition will be maintained Outcome: Progressing   Problem: Pain Managment: Goal: General experience of comfort will improve Outcome: Progressing   Problem: Safety: Goal: Ability to remain free from injury will improve Outcome: Progressing   Problem: Skin Integrity: Goal: Risk for impaired skin integrity will decrease Outcome: Progressing

## 2023-02-05 NOTE — Evaluation (Addendum)
Physical Therapy Evaluation Patient Details Name: Holly Hubbard MRN: [id number] DOB: 11-May-1941 Today's Date: 02/05/2023  History of Present Illness  Pt is an 82 y/o F admitted on 02/04/23 after presenting with c/o mechanical fall & pt found to have acute proximal right femoral intertrochanteric fracture. Pt is s/p R IM nail on 02/04/23. 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  Clinical Impression  Pt seen for PT evaluation with co-tx with OT for pt & therapists' safety. Prior to admission pt utilized rollator or transport chair for mobility. On this date, pt requires max assist for bed mobility, min assist +2 for STS & step pivot bed>recliner on L with RW. Pt unable to take steps during transfer, instead shuffling feet to maneuver over to recliner. Recommend ongoing PT services in post acute setting <3 hours/day. Will continue to follow pt acutely to progress mobility as able.    Addendum: Nurse cleared pt for participation in PT tx as pt recently started receiving IV blood transfusion (pt already has 1 set of vitals performed - WNL). No signs/symptoms during session.      If plan is discharge home, recommend the following: A lot of help with walking and/or transfers;A lot of help with bathing/dressing/bathroom;Assist for transportation;Assistance with cooking/housework;Direct supervision/assist for financial management;Help with stairs or ramp for entrance;Direct supervision/assist for medications management   Can travel by private vehicle   No    Equipment Recommendations None recommended by PT (defer to next venue)  Recommendations for Other Services       Functional Status Assessment Patient has had a recent decline in their functional status and demonstrates the ability to make significant improvements in function in a reasonable and predictable amount of time.     Precautions / Restrictions  Precautions Precautions: Fall Restrictions Weight Bearing Restrictions: Yes RLE Weight Bearing: Weight bearing as tolerated      Mobility  Bed Mobility Overal bed mobility: Needs Assistance Bed Mobility: Supine to Sit     Supine to sit: Max assist, HOB elevated     General bed mobility comments: Cuing for technique, assistance to move RLE to EOB, fully scoot to sitting EOB & upright trunk.    Transfers Overall transfer level: Needs assistance Equipment used: Rolling walker (2 wheels) Transfers: Sit to/from Stand, Bed to chair/wheelchair/BSC Sit to Stand: Min assist, +2 physical assistance, From elevated surface   Step pivot transfers: Min assist, +2 physical assistance (Pt transfers bed>recliner on L with min assist +2 with RW, Pt requires cuing for RW management, sequencing. Doesn't step/lift feet, but instead scoots them along with cuing & extra time.)       General transfer comment: cuing to scoot out to edge of seat, for hand placement, assistance to power up to standing    Ambulation/Gait                  Stairs            Wheelchair Mobility     Tilt Bed    Modified Rankin (Stroke Patients Only)       Balance Overall balance assessment: Needs assistance Sitting-balance support: Feet supported, No upper extremity supported Sitting balance-Leahy Scale: Good     Standing balance support: Bilateral upper extremity supported, Reliant on assistive device for balance Standing balance-Leahy Scale: Poor  Pertinent Vitals/Pain Pain Assessment Pain Assessment: Faces Faces Pain Scale: Hurts even more Pain Location: RLE with movement Pain Descriptors / Indicators: Guarding, Grimacing, Discomfort Pain Intervention(s): Monitored during session, Repositioned    Home Living Family/patient expects to be discharged to:: Private residence Living Arrangements: Spouse/significant other Available Help at  Discharge: Family;Available 24 hours/day (husband available but he uses [facility] so he cannot provide a lot of physical assistance) Type of Home: Apartment Home Access: Level entry       Home Layout: One level Home Equipment: Agricultural consultant (2 wheels);Rollator (4 wheels);Tub bench;Grab bars - tub/shower;Hand held shower head;Transport chair Additional Comments: pt and husband currently living in an apartment after moving to the area in the winter. They have bought a home, but it is being renovated, so they are living in an apartment at this time. Daughter lives in town as well, but works full time.    Prior Function Prior Level of Function : Needs assist             Mobility Comments: Pt used rollator for mobility around the house; husband assisted her with transport chair out to the car; she was able to do car transfers from w/c to car without physical assist. Was participating in OPPT prior to admission for balance and mobility. Had a CVA in 2023 that affected R side. ADLs Comments: Pt was (I) with grooming, dressing, eating, toileting; husband assisted with bathing minimally; pt used shower chair. Pt used to play tennis (she states she has recently); husband/daughter state that pt has not played tennis in over 6 years. She enjoys shopping and mostly spends time looking out the window; daughter states they have tried to get her engaged in other activities, but she does not show interest and cognitively has difficulty. Husband and daughter do all IADLs, although sometimes pt comes along for grocery trips.     Hand Dominance        Extremity/Trunk Assessment   Upper Extremity Assessment Upper Extremity Assessment: Overall WFL for tasks assessed    Lower Extremity Assessment Lower Extremity Assessment: Generalized weakness RLE Deficits / Details: R knee extension 2+/5 in sitting       Communication   Communication: No difficulties  Cognition Arousal/Alertness: Awake/alert Behavior  During Therapy: WFL for tasks assessed/performed Overall Cognitive Status: History of cognitive impairments - at baseline                                 General Comments: Pt with hx of dementia, follows simple commands inconsistently with extra time, oriented to self, place, somewhat to situation.        General Comments General comments (skin integrity, edema, etc.): Husband in room during session, daughter arriving midway through session.    Exercises     Assessment/Plan    PT Assessment Patient needs continued PT services  PT Problem List Decreased strength;Pain;Decreased range of motion;Decreased activity tolerance;Decreased knowledge of use of DME;Decreased safety awareness;Decreased balance;Decreased mobility;Decreased knowledge of precautions       PT Treatment Interventions DME instruction;Therapeutic exercise;Balance training;Gait training;Stair training;Neuromuscular re-education;Functional mobility training;Therapeutic activities;Patient/family education;Modalities;Manual techniques    PT Goals (Current goals can be found in the Care Plan section)  Acute Rehab PT Goals Patient Stated Goal: decreased pain, return home when safe/able PT Goal Formulation: With patient/family Time For Goal Achievement: 02/19/23 Potential to Achieve Goals: Fair    Frequency 7X/week     Co-evaluation PT/OT/SLP Co-Evaluation/Treatment: Yes  Reason for Co-Treatment: Complexity of the patient's impairments (multi-system involvement);For patient/therapist safety PT goals addressed during session: Mobility/safety with mobility;Proper use of DME;Balance OT goals addressed during session: ADL's and self-care;Proper use of Adaptive equipment and DME       AM-PAC PT "6 Clicks" Mobility  Outcome Measure Help needed turning from your back to your side while in a flat bed without using bedrails?: A Lot Help needed moving from lying on your back to sitting on the side of a flat bed  without using bedrails?: Total Help needed moving to and from a bed to a chair (including a wheelchair)?: A Lot Help needed standing up from a chair using your arms (e.g., wheelchair or bedside chair)?: A Lot Help needed to walk in hospital room?: Total Help needed climbing 3-5 steps with a railing? : Total 6 Click Score: 9    End of Session Equipment Utilized During Treatment: Gait belt Activity Tolerance: Patient tolerated treatment well;Patient limited by pain Patient left: in bed;with call bell/phone within reach;with family/visitor present Nurse Communication: Mobility status (need for chair alarm box) PT Visit Diagnosis: Pain;Unsteadiness on feet (R26.81);Muscle weakness (generalized) (M62.81);Difficulty in walking, not elsewhere classified (R26.2);Other abnormalities of gait and mobility (R26.89) Pain - part of body: Hip    Time: 6962-9528 PT Time Calculation (min) (ACUTE ONLY): 36 min   Charges:   PT Evaluation $PT Eval Low Complexity: 1 Low   PT General Charges $$ ACUTE PT VISIT: 1 Visit         Aleda Grana, PT, DPT 02/05/23, 12:37 PM   Sandi Mariscal 02/05/2023, 12:35 PM

## 2023-02-05 NOTE — Plan of Care (Signed)

## 2023-02-05 NOTE — Evaluation (Signed)
Occupational Therapy Evaluation Patient Details Name: Holly Hubbard MRN: [id number] DOB: 1940-10-19 Today's Date: 02/05/2023   History of Present Illness Holly Hubbard is an 82 y/o F with PMH including dementia, DM2, THN, stroke 2023 (R side weak); admitted after a fall at home with R hip fx. Is now s/p IMN.   Clinical Impression   Patient received for OT evaluation. See flowsheet below for details of function. Generally, patient requiring MAX A for bed mobility, MIN A x2 for functional mobility (heel-toe sidesteps to the L towards chair with RW), and set up-MAX A for ADLs. Patient will benefit from continued OT while in acute care.       Recommendations for follow up therapy are one component of a multi-disciplinary discharge planning process, led by the attending physician.  Recommendations may be updated based on patient status, additional functional criteria and insurance authorization.   Assistance Recommended at Discharge Frequent or constant Supervision/Assistance  Patient can return home with the following Two people to help with walking and/or transfers;A lot of help with bathing/dressing/bathroom;Assistance with cooking/housework;Direct supervision/assist for medications management;Direct supervision/assist for financial management;Assist for transportation;Help with stairs or ramp for entrance    Functional Status Assessment  Patient has had a recent decline in their functional status and demonstrates the ability to make significant improvements in function in a reasonable and predictable amount of time.  Equipment Recommendations  Other (comment) (defer to next venue of care)    Recommendations for Other Services       Precautions / Restrictions Precautions Precautions: Fall Restrictions Weight Bearing Restrictions: No (WBAT)      Mobility Bed Mobility Overal bed mobility: Needs Assistance Bed Mobility: Supine to Sit     Supine to sit: Max assist,  HOB elevated     General bed mobility comments: difficulty with moving RLE and with scooting R hip forward once at EOB to get BIL feet on floor; OT assisted with pulling on bed pad to reposition R hip.    Transfers Overall transfer level: Needs assistance Equipment used: Rolling walker (2 wheels) Transfers: Sit to/from Stand Sit to Stand: Min assist, +2 physical assistance, From elevated surface           General transfer comment: Bed slightly elevated. Significant increase in R hip pain with sit to stand.      Balance Overall balance assessment: Needs assistance Sitting-balance support: Feet supported, No upper extremity supported Sitting balance-Leahy Scale: Good     Standing balance support: Bilateral upper extremity supported, Reliant on assistive device for balance Standing balance-Leahy Scale: Poor                             ADL either performed or assessed with clinical judgement   ADL Overall ADL's : Needs assistance/impaired Eating/Feeding: Independent;Sitting Eating/Feeding Details (indicate cue type and reason): pt able to take drinks of her coffee on bedside table without issue Grooming: Brushing hair;Set up;Sitting Grooming Details (indicate cue type and reason): seated at EOB                             Functional mobility during ADLs: Rolling walker (2 wheels);Minimal assistance;+2 for physical assistance;Cueing for safety;Cueing for sequencing General ADL Comments: Pt BIL UE appearing WFL. Pt will have significant deficits in LB ADLs and transfers due to RLE pain and difficulty weightbearing. Required +2 assist today for shuffling sidesteps to the L  to recliner chair (heel-toe pattern, unable to fully pick up either foot) with RW.     Vision Patient Visual Report: No change from baseline       Perception     Praxis      Pertinent Vitals/Pain Pain Assessment Pain Assessment: PAINAD (pain appears to be only with  movement/activity and weightbearing.) Breathing: normal Negative Vocalization: occasional moan/groan, low speech, negative/disapproving quality Facial Expression: sad, frightened, frown Body Language: tense, distressed pacing, fidgeting Consolability: no need to console PAINAD Score: 3 Pain Intervention(s): Limited activity within patient's tolerance, Monitored during session (RN can give paid medication soon, per husband)     Hand Dominance     Extremity/Trunk Assessment Upper Extremity Assessment Upper Extremity Assessment: Overall WFL for tasks assessed   Lower Extremity Assessment Lower Extremity Assessment: RLE deficits/detail RLE Deficits / Details: s/p IMN; WBAT       Communication Communication Communication: No difficulties   Cognition Arousal/Alertness: Awake/alert Behavior During Therapy: WFL for tasks assessed/performed Overall Cognitive Status: History of cognitive impairments - at baseline                                 General Comments: Pt has dx of dementia. She is pleasant and follows cues to the best of her physical ability. Able to communicate her needs/pain. Is oriented to person, place ("hospital near [place]"), grossly to time (states it is "August 2000"), and to situation ("I broke my right leg").     General Comments  On room air; husband in room throughout session; daughter arrived halfway through session (prior to pt mobilizing).    Exercises     Shoulder Instructions      Home Living Family/patient expects to be discharged to:: Private residence Living Arrangements: Spouse/significant other Available Help at Discharge: Family;Available 24 hours/day (husband available, however he uses a cane, so cannot physically assist with transfers) Type of Home: Apartment Home Access: Level entry     Home Layout: One level     Bathroom Shower/Tub: Chief Strategy Officer: Standard Bathroom Accessibility: Yes How Accessible:  Accessible via walker Home Equipment: Rolling Walker (2 wheels);Rollator (4 wheels);Tub bench;Grab bars - tub/shower;Hand held shower head;Transport chair   Additional Comments: pt and husband currently living in an apartment after moving to the area in the winter. They have bought a home, but it is being renovated, so they are living in an apartment at this time. Daughter lives in town as well, but works full time.      Prior Functioning/Environment Prior Level of Function : Needs assist             Mobility Comments: Pt used rollator for mobility around the house; husband assisted her with transport chair out to the car; she was able to do car transfers from w/c to car without physical assist. Was participating in OPPT prior to admission for balance and mobility. Had a CVA in 2023 that affected R side. ADLs Comments: Pt was (I) with grooming, dressing, eating, toileting; husband assisted with bathing minimally; pt used shower chair. Pt used to play tennis (she states she has recently); husband/daughter state that pt has not played tennis in over 6 years. She enjoys shopping and mostly spends time looking out the window; daughter states they have tried to get her engaged in other activities, but she does not show interest and cognitively has difficulty. Husband and daughter do all IADLs, although sometimes  pt comes along for grocery trips.        OT Problem List: Impaired balance (sitting and/or standing);Decreased strength;Decreased activity tolerance;Decreased cognition;Decreased safety awareness;Decreased knowledge of use of DME or AE      OT Treatment/Interventions: Self-care/ADL training;Therapeutic exercise;DME and/or AE instruction;Therapeutic activities;Patient/family education    OT Goals(Current goals can be found in the care plan section) Acute Rehab OT Goals Patient Stated Goal: Go home; pain to stop OT Goal Formulation: With family Time For Goal Achievement:  02/19/23 Potential to Achieve Goals: Good ADL Goals Pt Will Perform Lower Body Bathing: sit to/from stand;with set-up Pt Will Perform Lower Body Dressing: with modified independence;with adaptive equipment;sit to/from stand Pt Will Transfer to Toilet: with supervision;ambulating;bedside commode Pt Will Perform Toileting - Clothing Manipulation and hygiene: with supervision;sit to/from stand  OT Frequency: Min 1X/week    Co-evaluation PT/OT/SLP Co-Evaluation/Treatment: Yes Reason for Co-Treatment: Complexity of the patient's impairments (multi-system involvement);For patient/therapist safety;To address functional/ADL transfers   OT goals addressed during session: ADL's and self-care;Proper use of Adaptive equipment and DME      AM-PAC OT "6 Clicks" Daily Activity     Outcome Measure Help from another person eating meals?: None Help from another person taking care of personal grooming?: None Help from another person toileting, which includes using toliet, bedpan, or urinal?: A Lot Help from another person bathing (including washing, rinsing, drying)?: A Lot Help from another person to put on and taking off regular upper body clothing?: None Help from another person to put on and taking off regular lower body clothing?: Total 6 Click Score: 17   End of Session Equipment Utilized During Treatment: Rolling walker (2 wheels);Gait belt Nurse Communication: Mobility status;Other (comment) (chair alarm box missing)  Activity Tolerance: Patient limited by pain;Patient tolerated treatment well Patient left: in chair;with call bell/phone within reach;with nursing/sitter in room;with family/visitor present (NT in room; NT aware that chair alarm box is not present in the room)  OT Visit Diagnosis: Unsteadiness on feet (R26.81);History of falling (Z91.81)                Time: 8119-1478 OT Time Calculation (min): 38 min Charges:  OT General Charges $OT Visit: 1 Visit OT Evaluation $OT Eval  Moderate Complexity: 1 Mod OT Treatments $Self Care/Home Management : 8-22 mins  Linward Foster, MS, OTR/L   Alvester Morin 02/05/2023, 11:56 AM

## 2023-02-06 DIAGNOSIS — D649 Anemia, unspecified: Principal | ICD-10-CM

## 2023-02-06 DIAGNOSIS — S72141A Displaced intertrochanteric fracture of right femur, initial encounter for closed fracture: Secondary | ICD-10-CM | POA: Diagnosis not present

## 2023-02-06 LAB — GLUCOSE, CAPILLARY
Glucose-Capillary: 164 mg/dL — ABNORMAL HIGH (ref 70–99)
Glucose-Capillary: 237 mg/dL — ABNORMAL HIGH (ref 70–99)
Glucose-Capillary: 224 mg/dL — ABNORMAL HIGH (ref 70–99)
Glucose-Capillary: 149 mg/dL — ABNORMAL HIGH (ref 70–99)

## 2023-02-06 NOTE — Progress Notes (Signed)
Physical Therapy Treatment Patient Details Name: Holly Hubbard MRN: [id number] DOB: 05/17/1941 Today's Date: 02/06/2023   History of Present Illness Pt is an 82 y/o F admitted on 02/04/23 after presenting with c/o mechanical fall & pt found to have acute proximal right femoral intertrochanteric fracture. Pt is s/p R IM nail on 02/04/23. 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 Pt performs RLE AAROM exercises for strengthening; pt unable to move RLE through exercises without assistance. Pt requires +2 for STS as pt unable to clear buttocks during STS with max assist +1. Pt progresses to ambulating ~3 ft with max assist +2 with impaired gait pattern as noted below, pt limited by pain in RLE. Will continue to follow pt acutely to progress mobility as able.    If plan is discharge home, recommend the following: Assist for transportation;Assistance with cooking/housework;Direct supervision/assist for financial management;Help with stairs or ramp for entrance;Direct supervision/assist for medications management;Two people to help with walking and/or transfers;Two people to help with bathing/dressing/bathroom   Can travel by private vehicle     No  Equipment Recommendations  None recommended by PT (defer to next venue)    Recommendations for Other Services       Precautions / Restrictions Precautions Precautions: Fall Restrictions Weight Bearing Restrictions: Yes RLE Weight Bearing: Weight bearing as tolerated     Mobility  Bed Mobility               General bed mobility comments: not tested, pt received & left in recliner    Transfers Overall transfer level: Needs assistance Equipment used: Rolling walker (2 wheels) Transfers: Sit to/from Stand Sit to Stand: Mod assist, +2 physical assistance           General transfer comment:  Attempted STS x 3 times but unable to clear buttocks with max assist +1, pt then able to complete STS wtih mod assist +2. Pt requires mod assist to scoot out to edge of seat (scooting R hip forward, pt able to scoot L hip) in preparation for STS transfer. PT provides cuing for hand placement.    Ambulation/Gait Ambulation/Gait assistance: Max assist, +2 physical assistance Gait Distance (Feet): 3 Feet Assistive device: Rolling walker (2 wheels) Gait Pattern/deviations: Decreased step length - right, Decreased dorsiflexion - left, Decreased dorsiflexion - right, Decreased step length - left, Decreased stride length Gait velocity: decreased     General Gait Details: Pt able to advance RLE with MAX encouragement/cuing but very decreased foot clearance. Pt shuffles L foot forward to advance it. Pt with decreased weight shifting to RLE 2/2 pain.   Stairs             Wheelchair Mobility     Tilt Bed    Modified Rankin (Stroke Patients Only)       Balance Overall balance assessment: Needs assistance Sitting-balance support: Feet supported, No upper extremity supported Sitting balance-Leahy Scale: Good     Standing balance support: Bilateral upper extremity supported, Reliant on assistive device for balance, During functional activity Standing balance-Leahy Scale: Poor                              Cognition Arousal/Alertness: Awake/alert Behavior During Therapy: WFL for tasks assessed/performed Overall Cognitive Status: History of cognitive impairments - at baseline  General Comments: Pt follows simple commands with extra time, unaware of broken bone & sx despite PT & spouse educating her multiple times during session.        Exercises Total Joint Exercises Heel Slides: AAROM, Strengthening, Right, 20 reps Long Arc Quad: AAROM, Seated, Strengthening, Right, 20 reps    General Comments        Pertinent  Vitals/Pain Pain Assessment Pain Assessment: Faces Faces Pain Scale: Hurts whole lot Pain Location: RLE hip/buttocks Pain Descriptors / Indicators: Crying, Discomfort, Guarding, Grimacing Pain Intervention(s): Monitored during session, Limited activity within patient's tolerance, Repositioned (requested pain meds)    Home Living                          Prior Function            PT Goals (current goals can now be found in the care plan section) Acute Rehab PT Goals Patient Stated Goal: decreased pain, return home when safe/able PT Goal Formulation: With patient/family Time For Goal Achievement: 02/19/23 Potential to Achieve Goals: Fair Progress towards PT goals: Progressing toward goals    Frequency    7X/week      PT Plan Current plan remains appropriate    Co-evaluation              AM-PAC PT "6 Clicks" Mobility   Outcome Measure  Help needed turning from your back to your side while in a flat bed without using bedrails?: A Lot Help needed moving from lying on your back to sitting on the side of a flat bed without using bedrails?: Total Help needed moving to and from a bed to a chair (including a wheelchair)?: Total Help needed standing up from a chair using your arms (e.g., wheelchair or bedside chair)?: Total Help needed to walk in hospital room?: Total Help needed climbing 3-5 steps with a railing? : Total 6 Click Score: 7    End of Session Equipment Utilized During Treatment: Gait belt Activity Tolerance: Patient limited by pain Patient left: in chair;with call bell/phone within reach;with family/visitor present   PT Visit Diagnosis: Pain;Unsteadiness on feet (R26.81);Muscle weakness (generalized) (M62.81);Difficulty in walking, not elsewhere classified (R26.2);Other abnormalities of gait and mobility (R26.89) Pain - Right/Left: Right Pain - part of body: Hip     Time: 1041-1105 PT Time Calculation (min) (ACUTE ONLY): 24 min  Charges:     $Gait Training: 8-22 mins $Therapeutic Exercise: 8-22 mins PT General Charges $$ ACUTE PT VISIT: 1 Visit                     Aleda Grana, PT, DPT 02/06/23, 11:12 AM   Sandi Mariscal 02/06/2023, 11:11 AM

## 2023-02-06 NOTE — Progress Notes (Signed)
Occupational Therapy Treatment Patient Details Name: Holly Hubbard MRN: [id number] DOB: 07-Dec-1940 Today's Date: 02/06/2023   History of present illness Pt is an 82 y/o F admitted on 02/04/23 after presenting with c/o mechanical fall & pt found to have acute proximal right femoral intertrochanteric fracture. Pt is s/p R IM nail on 02/04/23. 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 Pt is agreeable to OT intervention. Pt washes face with set up A to obtain cloth. Pt then reports need for toileting. OT providing pt with bedpan secondary to urgency but pt cries out and becomes resistive with rolling L and also R and therapist unable to place under pt. Max A for bed mobility with pt continuing to cry out. Mod A of 2 to stand from EOB and min A of 2 to take several steps over to [facility]. Pt able to void and then stands with one person while second assists with self care needs. Pt turning to sit in recliner chair at end of session in same manner as above. Family remains in room. Call bell and all needed items within reach.    Recommendations for follow up therapy are one component of a multi-disciplinary discharge planning process, led by the attending physician.  Recommendations may be updated based on patient status, additional functional criteria and insurance authorization.       Patient can return home with the following  Two people to help with walking and/or transfers;A lot of help with bathing/dressing/bathroom;Assistance with cooking/housework;Direct supervision/assist for medications management;Direct supervision/assist for financial management;Assist for transportation;Help with stairs or ramp for entrance   Equipment Recommendations  Other (comment) (defer to next venue of care)       Precautions / Restrictions Precautions Precautions:  Fall Restrictions Weight Bearing Restrictions: Yes RLE Weight Bearing: Weight bearing as tolerated       Mobility Bed Mobility Overal bed mobility: Needs Assistance Bed Mobility: Supine to Sit     Supine to sit: Max assist          Transfers Overall transfer level: Needs assistance Equipment used: Rolling walker (2 wheels) Transfers: Sit to/from Stand, Bed to chair/wheelchair/BSC Sit to Stand: Mod assist, +2 physical assistance, Min assist     Step pivot transfers: Min assist, +2 physical assistance           Balance Overall balance assessment: Needs assistance Sitting-balance support: Feet supported, No upper extremity supported Sitting balance-Leahy Scale: Good     Standing balance support: Bilateral upper extremity supported, Reliant on assistive device for balance, During functional activity Standing balance-Leahy Scale: Poor                             ADL either performed or assessed with clinical judgement   ADL Overall ADL's : Needs assistance/impaired                         Toilet Transfer: Moderate assistance;+2 for physical assistance;+2 for safety/equipment;BSC/3in1   Toileting- Clothing Manipulation and Hygiene: Moderate assistance;Sit to/from stand;Maximal assistance              Extremity/Trunk Assessment Upper Extremity Assessment Upper Extremity Assessment: Generalized weakness   Lower Extremity Assessment Lower Extremity Assessment: Generalized weakness        Vision Patient Visual  Report: No change from baseline            Cognition Arousal/Alertness: Awake/alert Behavior During Therapy: WFL for tasks assessed/performed Overall Cognitive Status: History of cognitive impairments - at baseline                                 General Comments: Pt follows simple commands with increased time but needs cuing for technique and redirection                   Pertinent Vitals/ Pain        Pain Assessment Pain Assessment: Faces Faces Pain Scale: Hurts whole lot Pain Location: RLE hip/buttocks Pain Descriptors / Indicators: Discomfort, Guarding, Grimacing Pain Intervention(s): Monitored during session, Repositioned         Frequency  Min 1X/week        Progress Toward Goals  OT Goals(current goals can now be found in the care plan section)  Progress towards OT goals: Progressing toward goals     Plan Discharge plan remains appropriate;Frequency remains appropriate       AM-PAC OT "6 Clicks" Daily Activity     Outcome Measure   Help from another person eating meals?: None Help from another person taking care of personal grooming?: None Help from another person toileting, which includes using toliet, bedpan, or urinal?: A Lot Help from another person bathing (including washing, rinsing, drying)?: A Lot Help from another person to put on and taking off regular upper body clothing?: A Little Help from another person to put on and taking off regular lower body clothing?: A Lot 6 Click Score: 17    End of Session Equipment Utilized During Treatment: Rolling walker (2 wheels)  OT Visit Diagnosis: Unsteadiness on feet (R26.81);History of falling (Z91.81)   Activity Tolerance Patient limited by pain;Patient tolerated treatment well   Patient Left in chair;with call bell/phone within reach;with nursing/sitter in room;with family/visitor present   Nurse Communication Mobility status;Other (comment) (no chair alarm box)        Time: 1441-1501 OT Time Calculation (min): 20 min  Charges: OT General Charges $OT Visit: 1 Visit OT Treatments $Self Care/Home Management : 8-22 mins  Jackquline Denmark, MS, OTR/L , CBIS ascom [phone]  02/06/23, 3:11 PM

## 2023-02-06 NOTE — Progress Notes (Signed)
Orthopedic progress note   Chief complaint right hip fracture   Subjective, no acute events   Objective, chart reviewed Hemoglobin yesterday was  8.6 s/p 1 U pRBC CBC today is pending Lovenox is on hold   Surgical dressing is clean and dry.  There is no strikethrough.  There is no swelling of the thigh or any evidence of bleeding or hematoma.   Assessment and plan   82 year old female postoperative day #2 status post right hip cephalomedullary nail for right intertrochanteric hip fracture.  Overall doing well.  Postoperative acute blood loss anemia seems to be resolving status post packed red blood cell transfusion yesterday.  Labs this morning are pending.   Recommend holding chemical DVT prophylaxis until hemoglobin stabilizes. Weightbearing as tolerated right lower extremity. 3.   She has Aquacel surgical bandage in place     Thank you.   I am off service today.   Please page orthopedics with any questions or concerns.     Holly Hubbard, ortho

## 2023-02-06 NOTE — Progress Notes (Signed)
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 She got up from bed and walked to the bathroom with her walker and tripped and fell to her right side. No LOC, chest pain, shortness of breath, confusion. Event was witnessed by her husband.     Assessment and Plan:  Right Hip Fracture secondary to mechanical fall s/p 8/3 ORIF: - Appreciate Orthopedic Surgery assistance and recommendations. -Continue PT/OT - Lovenox for DVT prophylaxis is on hold due to acute blood loss anemia -SCDs for DVT prophylaxis    Acute Blood Loss Anemia s/p 8/3 ORIF: -Patient noted to have a drop in her H&H from 10 >> 7 -Patient received 1 unit of packed RBC with an initial improvement in her hemoglobin to 8.6 and down to 7.5 today -Monitor H&H closely    Mild AKI, pre-renal: - Creatinine increased from 1.06 to 1.33 mg/dL. -Renal function improved with IV fluid hydration   Chronic Dementia with behavioral disturbances: Mental status is at baseline. - Continue home Donepezil. - Follow up with regular [facility] Neurologist Dr. Regino Schultze.   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 - Continue Statin.   Diabetes Mellitus Type 2: A1C is 7.3%. Glucose is a little elevated. - Continue sliding scale and monitor POC glucose ACHS. - Patient had diarrhea on Metformin and family requests alternative medication.  We will refer patient to endocrinologist on discharge to discuss alternative diabetes  medications.     Essential Hypertension: BP is low normal while on pain medications. - Hold home Amlodipine and Telmisartan.   Hyperlipidemia: - Statin.   GERD: - PPI.   Cognitive Dysfunction: - Follow up with [facility].   Chronic Constipation: - Colace and Miralax as needed.         Subjective: Patient is seen and examined at the bedside.  Husband is at the bedside.  Has no new complaints  Physical Exam: Vitals:   02/05/23 1614 02/06/23 0003 02/06/23 0656 02/06/23 0803  BP: 127/62 139/80  (!) 117/55  Pulse: 74 78  72  Resp: 16 16 16 16   Temp: 97.7 F (36.5 C) 98.1 F (36.7 C)  97.8 F (36.6 C)  TempSrc: Oral     SpO2: 94% 96%  97%  Weight:      Height:       Physical Exam Vitals and nursing note reviewed.  Constitutional:      Comments: Thin and frail  HENT:     Head: Normocephalic and atraumatic.     Nose: Nose normal.     Mouth/Throat:     Mouth: Mucous membranes are moist.  Eyes:     Conjunctiva/sclera: Conjunctivae normal.  Cardiovascular:     Rate and Rhythm: Normal rate and regular rhythm.  Pulmonary:     Effort: Pulmonary effort is normal.     Breath sounds: Normal breath sounds.  Abdominal:     General: Abdomen is flat. Bowel sounds are normal.     Palpations: Abdomen is soft.  Musculoskeletal:  Cervical back: Normal range of motion and neck supple.     Comments: Decreased range of motion right hip  Skin:    General: Skin is warm and dry.  Neurological:     Mental Status: She is alert and oriented to person, place, and time.     Motor: Weakness present.  Psychiatric:        Mood and Affect: Mood normal.        Behavior: Behavior normal.     Data Reviewed: Labs reviewed. Hemoglobin 7.5 There are no new results to review at this time.  Family Communication: Plan of care discussed with patient's husband at the bedside.  Requesting to speak with TOC about skilled nursing facilities for subacute  rehab  Disposition: Status is: Inpatient Remains inpatient appropriate because: Awaiting discharge to SNF  Planned Discharge Destination: Skilled nursing facility    Time spent: 32  minutes  Author: Lucile Shutters, MD 02/06/2023 1:20 PM  For on call review [email or web address].

## 2023-02-07 DIAGNOSIS — S72141A Displaced intertrochanteric fracture of right femur, initial encounter for closed fracture: Secondary | ICD-10-CM | POA: Diagnosis not present

## 2023-02-07 LAB — GLUCOSE, CAPILLARY
Glucose-Capillary: 143 mg/dL — ABNORMAL HIGH (ref 70–99)
Glucose-Capillary: 193 mg/dL — ABNORMAL HIGH (ref 70–99)
Glucose-Capillary: 176 mg/dL — ABNORMAL HIGH (ref 70–99)
Glucose-Capillary: 225 mg/dL — ABNORMAL HIGH (ref 70–99)

## 2023-02-07 LAB — PREPARE RBC (CROSSMATCH)

## 2023-02-07 MED ORDER — SODIUM CHLORIDE 0.9% IV SOLUTION: Freq: Once | INTRAVENOUS | Status: AC

## 2023-02-07 MED ORDER — MIRTAZAPINE 15 MG PO TBDP
15.0000 mg | ORAL_TABLET | Freq: Every day | ORAL | Status: DC
  Administered 2023-02-07 – 2023-02-08 (×2): 15 mg via ORAL
  Filled 2023-02-07 (×3): qty 1

## 2023-02-07 NOTE — Plan of Care (Signed)

## 2023-02-07 NOTE — Progress Notes (Signed)
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 She got up from bed and walked to the bathroom with her walker and tripped and fell to her right side. No LOC, chest pain, shortness of breath, confusion. Event was witnessed by her husband.     Assessment and Plan:  Right Hip Fracture secondary to mechanical fall s/p 8/3 ORIF: - Appreciate Orthopedic Surgery assistance and recommendations. -Continue PT/OT - Lovenox for DVT prophylaxis is on hold due to acute blood loss anemia requiring blood transfusion. -SCDs for DVT prophylaxis     Acute Blood Loss Anemia s/p 8/3 ORIF: -Patient noted to have a drop in her H&H from 10 >> 7 -Patient received 1 unit of packed RBC with an initial improvement in her hemoglobin to 8.6 >> 7.5 >> 7.3 -Will transfuse patient 1 more unit of packed RBC prior to discharge in a.m.     Mild AKI, pre-renal: - Renal function is back to baseline    Dementia with behavioral disturbances: Mental status is at baseline. - Continue home Donepezil. - Follow up with regular [facility] Neurologist Dr. Regino Schultze.   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 - Continue Statin.   Diabetes Mellitus Type 2: A1C is 7.3%. Glucose is a little elevated. - Continue sliding scale and monitor POC glucose ACHS. - Patient had diarrhea on Metformin and family requests alternative medication.  We will refer patient to endocrinologist on discharge to discuss alternative diabetes  medications.     Essential Hypertension: BP is low normal while on pain medications. - Hold home Amlodipine and Telmisartan.   Hyperlipidemia: - Statin.   GERD: - PPI.   Cognitive Dysfunction: - Follow up with [facility] Neurologist and [facility].   Chronic Constipation: - Colace and Miralax as needed.       Subjective: Patient is seen and examined at the bedside.  No new complaints  Physical Exam: Vitals:   02/06/23 1548 02/06/23 1614 02/06/23 2202 02/07/23 0738  BP: (!) 118/56  117/75 115/77  Pulse: (!) 50  (!) 52 86  Resp: 16 18 20 16   Temp: 98.5 F (36.9 C)  98.6 F (37 C) 98.7 F (37.1 C)  TempSrc:      SpO2: 99%  99% 99%  Weight:      Height:       Vitals and nursing note reviewed.  Constitutional:      Comments: Thin and frail  HENT:     Head: Normocephalic and atraumatic.     Nose: Nose normal.     Mouth/Throat:     Mouth: Mucous membranes are moist.  Eyes:     Conjunctiva/sclera: Conjunctivae normal.  Cardiovascular:     Rate and Rhythm: Bradycardia Pulmonary:     Effort: Pulmonary effort is normal.     Breath sounds: Normal breath sounds.  Abdominal:     General: Abdomen is flat. Bowel sounds are normal.     Palpations: Abdomen is soft.  Musculoskeletal:     Cervical back: Normal range of motion  and neck supple.     Comments: Decreased range of motion right hip  Skin:    General: Skin is warm and dry.  Neurological:     Mental Status: She is alert and oriented to person, place, and time.     Motor: Weakness present.  Psychiatric:        Mood and Affect: Mood normal.        Behavior: Behavior normal.    Data Reviewed: Labs reviewed.  Hemoglobin 7.3 There are no new results to review at this time.  Family Communication: Plan of care discussed with patient's husband at the bedside.  He verbalizes understanding agrees with plan  Disposition: Status is: Inpatient Remains inpatient appropriate because: Requires blood transfusion  Planned  Discharge Destination: Skilled nursing facility    Time spent: 35 minutes  Author: Lucile Shutters, MD 02/07/2023 1:03 PM  For on call review [email or web address].

## 2023-02-07 NOTE — Progress Notes (Signed)
Pt Hgb 7.3 with orders to transfuse. IV team called to place new line and requested no OOB to chair until IV is placed, therefore pt seen for education and strengthening. Pt required assist to complete all ROM except PF/DF, minimal Right quad contraction noted. Continue mobility after IV team addresses new line.   02/07/23 1202  PT Visit Information  Assistance Needed +1  History of Present Illness Pt is an 82 y/o F admitted on 02/04/23 after presenting with c/o mechanical fall & pt found to have acute proximal right femoral intertrochanteric fracture. Pt is s/p R IM nail on 02/04/23. 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  Subjective Data  Patient Stated Goal decreased pain, return home when safe/able  Precautions  Precautions Fall  Restrictions  Weight Bearing Restrictions Yes  RLE Weight Bearing WBAT  Pain Assessment  Pain Assessment 0-10  Pain Score 5  Pain Location RLE hip/buttocks  Pain Descriptors / Indicators Discomfort;Guarding;Grimacing  Pain Intervention(s) Premedicated before session  Cognition  Arousal/Alertness Awake/alert  Behavior During Therapy WFL for tasks assessed/performed  Overall Cognitive Status History of cognitive impairments - at baseline  General Comments Pt follows simple commands with increased time but needs cuing for technique and redirection  Bed Mobility  General bed mobility comments deferred - awaiting IV team (Use of chuck pad to slide pt to edge of bed)  General Comments  General comments (skin integrity, edema, etc.) Hgb 7.3, awaiting IV team for new line placement. Unable to get OOB until seen  Exercises  Exercises General Lower Extremity  General Exercises - Lower Extremity  Ankle Circles/Pumps AROM;Both;20 reps;Supine  Quad Sets AROM;Right;5 reps;Supine (limited quad contraction noted)  Short Arc [employer];Right;10 reps;Supine  Heel Slides AAROM;Right;10 reps;Supine  Hip  ABduction/ADduction AAROM;Right;10 reps;Supine  Straight Leg Raises PROM;Right;5 reps;Supine  PT - End of Session  Activity Tolerance Other (comment) (IV team requesting pt to stay in bed until new line is placed)  Patient left in bed;with call bell/phone within reach;with bed alarm set;with family/visitor present  Nurse Communication Mobility status   PT - Assessment/Plan  PT Plan Current plan remains appropriate  PT Visit Diagnosis Pain;Unsteadiness on feet (R26.81);Muscle weakness (generalized) (M62.81);Difficulty in walking, not elsewhere classified (R26.2);Other abnormalities of gait and mobility (R26.89)  Pain - Right/Left Right  Pain - part of body Hip  PT Frequency (ACUTE ONLY) 7X/week  Follow Up Recommendations Skilled nursing-short term rehab (<3 hours/day)  Can patient physically be transported by private vehicle No  Assistance recommended at discharge Frequent or constant Supervision/Assistance  Patient can return home with the following Assist for transportation;Assistance with cooking/housework;Direct supervision/assist for financial management;Help with stairs or ramp for entrance;Direct supervision/assist for medications management;Two people to help with walking and/or transfers;Two people to help with bathing/dressing/bathroom  PT equipment None recommended by PT (defer to next level of care)  AM-PAC PT "6 Clicks" Mobility Outcome Measure (Version 2)  Help needed turning from your back to your side while in a flat bed without using bedrails? 2  Help needed moving from lying on your back to sitting on the side of a flat bed without using bedrails? 1  Help needed moving to and from a bed to a chair (including a wheelchair)? 1  Help needed standing up from a chair using your arms (e.g., wheelchair or bedside chair)? 1  Help needed to walk in hospital room? 1  Help needed climbing 3-5 steps with a railing?  1  6 Click Score 7  Consider Recommendation of Discharge To:  CIR/SNF/LTACH  Progressive Mobility  What is the highest level of mobility based on the progressive mobility assessment? Level 3 (Stands with assist) - Balance while standing  and cannot march in place  Activity Contraindicated/medical hold (Awaiting IV team, mobility deferred)  PT Time Calculation  PT Start Time (ACUTE ONLY) 1202  PT Stop Time (ACUTE ONLY) 1223  PT Time Calculation (min) (ACUTE ONLY) 21 min  PT General Charges  $$ ACUTE PT VISIT 1 Visit  PT Treatments  $Therapeutic Exercise 8-22 mins   Zadie Cleverly, PTA

## 2023-02-07 NOTE — NC FL2 (Signed)
[place] MEDICAID FL2 LEVEL OF CARE FORM     IDENTIFICATION  Patient Name: Holly Hubbard Birthdate: 1940/09/14 Sex: female Admission Date (Current Location): 02/04/2023  [facility] and [place] Number:  Chiropodist and Address:  [facility], [address], [place], [place] [address]      Provider Number: [id number]  Attending Physician Name and Address:  Lucile Shutters, MD  Relative Name and Phone Number:  Yeilani Wolle [phone]    Current Level of Care: Hospital Recommended Level of Care:   Prior Approval Number:    Date Approved/Denied:   PASRR Number: [id number]  Discharge Plan: SNF    Current Diagnoses: Patient Active Problem List   Diagnosis Date Noted   Intertrochanteric fracture of right femur, closed, initial encounter (HCC) 02/04/2023   Pubic ramus fracture, right, closed, initial encounter (HCC) 02/04/2023   Cognitive dysfunction 02/04/2023   Essential (primary) hypertension 05/10/2022   Hemiplegia and hemiparesis following cerebral infarction affecting right dominant side 04/2022(HCC) 05/10/2022   Type 2 diabetes mellitus without complications (HCC) 11/03/2016    Orientation RESPIRATION BLADDER Height & Weight     Self, Time, Situation  Normal Continent Weight: 58.5 kg Height:  5\' 6"  (167.6 cm)  BEHAVIORAL SYMPTOMS/MOOD NEUROLOGICAL BOWEL NUTRITION STATUS      Continent  (See Discharge Summary)  AMBULATORY STATUS COMMUNICATION OF NEEDS Skin   Extensive Assist Verbally Normal                       Personal Care Assistance Level of Assistance  Bathing, Feeding, Dressing Bathing Assistance: Maximum assistance Feeding assistance: Limited assistance Dressing Assistance: Maximum assistance     Functional Limitations Info  Sight, Hearing, Speech Sight Info: Adequate Hearing Info: Adequate Speech Info: Adequate    SPECIAL CARE FACTORS FREQUENCY  PT (By licensed PT), OT (By licensed OT)     PT  Frequency: 5x weekly OT Frequency: 5x weekly            Contractures Contractures Info: Not present    Additional Factors Info  Code Status, Allergies Code Status Info: Full Code Allergies Info: No Known Allergies           Current Medications (02/07/2023):  This is the current hospital active medication list Current Facility-Administered Medications  Medication Dose Route Frequency Provider Last Rate Last Admin   0.9 %  sodium chloride infusion (Manually program via Guardrails IV Fluids)   Intravenous Once Agbata, Tochukwu, MD       acetaminophen (TYLENOL) tablet 650 mg  650 mg Oral Q6H PRN Baldwin Jamaica, MD   650 mg at 02/05/23 0945   atorvastatin (LIPITOR) tablet 80 mg  80 mg Oral Daily Baldwin Jamaica, MD   80 mg at 02/07/23 1230   Chlorhexidine Gluconate Cloth 2 % PADS 6 each  6 each Topical Daily Baldwin Jamaica, MD   6 each at 02/05/23 1148   docusate sodium (COLACE) capsule 100 mg  100 mg Oral BID PRN Baldwin Jamaica, MD   100 mg at 02/07/23 0309   donepezil (ARICEPT) tablet 10 mg  10 mg Oral QHS Baldwin Jamaica, MD   10 mg at 02/06/23 2158   hydrALAZINE (APRESOLINE) injection 5 mg  5 mg Intravenous Q6H PRN Andris Baumann, MD       HYDROcodone-acetaminophen (NORCO/VICODIN) 5-325 MG per tablet 1 tablet  1 tablet Oral Q6H PRN Baldwin Jamaica, MD   1 tablet at 02/07/23 1056   HYDROmorphone (DILAUDID) injection 0.5 mg  0.5 mg Intravenous Q2H PRN Baldwin Jamaica, MD   0.5 mg at 02/06/23 1953   insulin aspart (novoLOG) injection 0-9 Units  0-9 Units Subcutaneous TID WC Baldwin Jamaica, MD   2 Units at 02/07/23 1229   mirtazapine (REMERON SOL-TAB) disintegrating tablet 15 mg  15 mg Oral QHS Agbata, Tochukwu, MD       pantoprazole (PROTONIX) EC tablet 20 mg  20 mg Oral Daily Baldwin Jamaica, MD   20 mg at 02/07/23 1032   polyethylene glycol (MIRALAX / GLYCOLAX) packet 17 g  17 g Oral Daily PRN Baldwin Jamaica, MD   17 g at 02/05/23 1858   tamsulosin (FLOMAX) capsule 0.4 mg  0.4 mg Oral  Daily Baldwin Jamaica, MD   0.4 mg at 02/07/23 1032     Discharge Medications: Please see discharge summary for a list of discharge medications.  Relevant Imaging Results:  Relevant Lab Results:   Additional Information [id number]  Garret Reddish, RN

## 2023-02-07 NOTE — Care Management Important Message (Signed)
Important Message  Patient Details  Name: Holly Hubbard MRN: [id number] Date of Birth: 1941/03/24   Medicare Important Message Given:  Yes     Johnell Comings 02/07/2023, 10:06 AM

## 2023-02-07 NOTE — Progress Notes (Signed)
Subjective: 3 Days Post-Op Procedure(s) (LRB): INTRAMEDULLARY (IM) NAIL INTERTROCHANTERIC (Right) Patient reports pain as mild.   Patient seen in rounds with Dr. Ernest Pine. Patient is doing well without any complaints regarding her right hip.  Patient does state that she feels itchy, but denies any shortness of breath or feeling like her throat is closing.  Denies any chest pain, N/V, fevers or chills.  States that she has gone to the bathroom We will continue with therapy today.  Plan is to go Rehab after hospital stay.  Objective: Vital signs in last 24 hours: Temp:  [98.5 F (36.9 C)-98.7 F (37.1 C)] 98.7 F (37.1 C) (08/06 0738) Pulse Rate:  [50-86] 86 (08/06 0738) Resp:  [16-20] 16 (08/06 0738) BP: (115-118)/(56-77) 115/77 (08/06 0738) SpO2:  [99 %] 99 % (08/06 0738)  Intake/Output from previous day:  Intake/Output Summary (Last 24 hours) at 02/07/2023 1257 Last data filed at 02/07/2023 1100 Gross per 24 hour  Intake 600 ml  Output 180 ml  Net 420 ml    Intake/Output this shift: Total I/O In: 480 [P.O.:480] Out: -   Labs: Recent Labs    02/05/23 0443 02/05/23 1351 02/06/23 0446 02/07/23 0421  HGB 7.0* 8.6* 7.5* 7.3*   Recent Labs    02/05/23 0443 02/05/23 1351 02/06/23 0446 02/07/23 0421  WBC 10.4  --  11.2*  --   RBC 2.53*  --  2.84*  --   HCT 21.3*   < > 23.2* 22.3*  PLT 171  --  132*  --    < > = values in this interval not displayed.   Recent Labs    02/06/23 0446 02/07/23 0421  NA 134* 135  K 3.8 3.8  CL 104 105  CO2 21* 24  BUN 23 22  CREATININE 1.01* 1.00  GLUCOSE 192* 159*  CALCIUM 8.3* 8.3*   No results for input(s): "LABPT", "INR" in the last 72 hours.  EXAM General - Patient is Alert, Appropriate, and Oriented Extremity - Neurologically intact ABD soft Neurovascular intact Sensation intact distally Intact pulses distally Dorsiflexion/Plantar flexion intact No cellulitis present Compartment soft Dressing - dressing C/D/I and no  drainage Motor Function - intact, moving foot and toes well on exam.  Patient is able to plantar and dorsiflex with good strength and range of motion of her right foot.  She is also able to wiggle her toes.  Patient had some difficulty performing an SLR, is able to do so with moderate help.  Patient is neurovascularly intact down her right lower extremity to all dermatomes.  Posterior tibial pulses appreciated. Aquacel bandages remain in place  Past Medical History:  Diagnosis Date   History of ischemic stroke     Assessment/Plan: 3 Days Post-Op Procedure(s) (LRB): INTRAMEDULLARY (IM) NAIL INTERTROCHANTERIC (Right) Principal Problem:   Intertrochanteric fracture of right femur, closed, initial encounter (HCC) Active Problems:   Essential (primary) hypertension   Hemiplegia and hemiparesis following cerebral infarction affecting right dominant side 04/2022(HCC)   Type 2 diabetes mellitus without complications (HCC)   Pubic ramus fracture, right, closed, initial encounter (HCC)   Cognitive dysfunction  Estimated body mass index is 20.82 kg/m as calculated from the following:   Height as of this encounter: 5\' 6"  (1.676 m).   Weight as of this encounter: 58.5 kg.  Patient has been running low with her hematocrit and hemoglobin noted on labs.  Previously patient has received 1 unit already.  Hemoglobin back down to 7.3 today.  Inpatient medicine has ordered  another packed unit of red blood cells.  Continue to monitor these levels  Continue to hold chemical DVT prophylaxis until hemoglobin stabilizes per Dr. Clemencia Course.  Continues to use SCDs  Patient is weightbearing as tolerated to her right lower extremity.  Continue to work with PT  Aquacel bandage in place without any drainage appreciated.  Patient is looking to transition to a short-term rehab facility  Plan to discharge tomorrow morning   Rayburn Go, PA-C [facility] 02/07/2023, 12:57 PM

## 2023-02-07 NOTE — Progress Notes (Signed)
OT Cancellation Note  Patient Details Name: Holly Hubbard MRN: [id number] DOB: [birth year]   Cancelled Treatment:    Reason Eval/Treat Not Completed: Medical issues which prohibited therapy. Pt having just finished working with PT and RN reports she is getting ready to start blood transfusion. OT unable to see pt at this time and will re-attempt when pt is next available.   Jackquline Denmark, MS, OTR/L , CBIS ascom [phone]  02/07/23, 3:07 PM

## 2023-02-07 NOTE — TOC Progression Note (Signed)
Transition of Care [facility]) - Progression Note    Patient Details  Name: Holly Hubbard MRN: [id number] Date of Birth: Nov 29, 1940  [facility] CM/SW Contact  Garret Reddish, RN Phone Number: 02/07/2023, 3:38 PM  Clinical Narrative:   Chart reviewed.  Noted that patient was admitted with right hip fracture.  Noted that patient patient had ORIF of right hip on 02/04/23.  Noted that hemoglobin has dropped during hospital course. Patient has received blood during this admission.   I have spoken with patient's husband Roe Coombs.  He informs me that prior to admission patient was able to get around with a 2 wheeled rolling walker.  Patient was able to bath and dress herself.  Her husband did assist her with bathing and dressing.  Mr. Brase reports that he would transport patient to appointments.  I have explained the SNF placement process to Mr. Vanerbeke. He informs me that he has 3 preferences: [facility], [facility], and [address].   I have informed Mr. Kollmann that [facility] does not accept patients that are not from their community.    He is agreeable to a bed search of his preferences and the [place] area facilities.  Mr. Thind does report that he does not want his wife to go to [facility] in [place].    TOC will continue to follow for discharge planning.     Expected Discharge Plan: Skilled Nursing Facility Barriers to Discharge: No Barriers Identified  Expected Discharge Plan and Services In-house Referral:  (Patient has a walker and a rollator at home and wheelchair at home.)   Post Acute Care Choice: Skilled Nursing Facility                                         Social Determinants of Health (SDOH) Interventions SDOH Screenings   Food Insecurity: No Food Insecurity (02/04/2023)  Housing: Patient Declined (02/04/2023)  Transportation Needs: No Transportation Needs (02/04/2023)  Utilities: Not At Risk (02/04/2023)  Tobacco Use: Low Risk   (02/02/2023)   Received from [facility]    Readmission Risk Interventions     No data to display

## 2023-02-07 NOTE — Progress Notes (Signed)
Physical Therapy Treatment Patient Details Name: Holly Hubbard MRN: [id number] DOB: 16-Dec-1940 Today's Date: 02/07/2023   History of Present Illness Pt is an 82 y/o F admitted on 02/04/23 after presenting with c/o mechanical fall & pt found to have acute proximal right femoral intertrochanteric fracture. Pt is s/p R IM nail on 02/04/23. 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 Pt cleared for OOB to chair with Hgb at 7.3 and non-symptomatic. MaxA to transfer to EOB with head of bed up. Increased time needed due to c/o pain despite being pre-medicated. Educated pt and family on proper transfer techniques and benefits of transferring towards L side due to pain and difficulty advancing R LE. Pt required Mod/MaxA to stand from elevated bed to RW several times until comfortable advancing a few steps to bedside chair. Pt required ModA for weight shifting, balance, and RW management. Overall improved tolerance this session. Will continue to progress as tolerated. Recs for STR prior to returning home continue to be appropriate.    If plan is discharge home, recommend the following: Assist for transportation;Assistance with cooking/housework;Direct supervision/assist for financial management;Help with stairs or ramp for entrance;Direct supervision/assist for medications management;Two people to help with walking and/or transfers;Two people to help with bathing/dressing/bathroom   Can travel by private vehicle     No  Equipment Recommendations  None recommended by PT    Recommendations for Other Services       Precautions / Restrictions Precautions Precautions: Fall Restrictions Weight Bearing Restrictions: Yes RLE Weight Bearing: Weight bearing as tolerated     Mobility  Bed Mobility Overal bed mobility: Needs Assistance Bed  Mobility: Supine to Sit     Supine to sit: Max assist, HOB elevated     General bed mobility comments:  (Utilized chucks pad to scoot pt to edge of bed)    Transfers Overall transfer level: Needs assistance Equipment used: Rolling walker (2 wheels) Transfers: Sit to/from Stand, Bed to chair/wheelchair/BSC Sit to Stand: Mod assist, Max assist, From elevated surface           General transfer comment:  (STS from bed x3 to increase pt confidence and proper technique prior to mobility)    Ambulation/Gait Ambulation/Gait assistance: Mod assist, Min assist Gait Distance (Feet):  (3) Assistive device: Rolling walker (2 wheels) Gait Pattern/deviations: Decreased step length - right, Decreased dorsiflexion - left, Decreased dorsiflexion - right, Decreased step length - left, Decreased stride length Gait velocity: decreased     General Gait Details: Pt able to advance RLE with MAX encouragement/cuing but very decreased foot clearance. Pt shuffles L foot forward to advance it. Pt with decreased weight shifting to RLE 2/2 pain.   Stairs             Wheelchair Mobility     Tilt Bed    Modified Rankin (Stroke Patients Only)       Balance Overall balance assessment: Needs assistance Sitting-balance support: Feet supported, No upper extremity supported Sitting balance-Leahy Scale: Good     Standing balance support: Bilateral upper extremity supported, Reliant on assistive device for balance, During functional activity Standing balance-Leahy Scale: Poor Standing balance comment: heavy reliance on RW                            Cognition Arousal/Alertness:  Awake/alert Behavior During Therapy: WFL for tasks assessed/performed Overall Cognitive Status: History of cognitive impairments - at baseline                                 General Comments: Pt follows simple commands with increased time but needs cuing for technique and redirection         Exercises General Exercises - Lower Extremity Ankle Circles/Pumps: AROM, Both, 20 reps, Supine Quad Sets: AROM, Right, 5 reps, Supine (limited quad contraction noted) Short Arc Quad: AAROM, Right, 10 reps, Supine Heel Slides: AAROM, Right, 10 reps, Supine Hip ABduction/ADduction: AAROM, Right, 10 reps, Supine Straight Leg Raises: PROM, Right, 5 reps, Supine    General Comments General comments (skin integrity, edema, etc.): Hgb 7.3, awaiting IV team for new line placement. Unable to get OOB until seen      Pertinent Vitals/Pain Pain Assessment Pain Assessment: 0-10 Pain Score: 6  Pain Location: RLE hip/buttocks Pain Descriptors / Indicators: Discomfort, Guarding, Grimacing Pain Intervention(s): Premedicated before session, Ice applied    Home Living                          Prior Function            PT Goals (current goals can now be found in the care plan section) Acute Rehab PT Goals Patient Stated Goal: decreased pain, return home when safe/able Progress towards PT goals: Progressing toward goals    Frequency    7X/week      PT Plan Current plan remains appropriate    Co-evaluation              AM-PAC PT "6 Clicks" Mobility   Outcome Measure  Help needed turning from your back to your side while in a flat bed without using bedrails?: A Lot Help needed moving from lying on your back to sitting on the side of a flat bed without using bedrails?: Total Help needed moving to and from a bed to a chair (including a wheelchair)?: Total Help needed standing up from a chair using your arms (e.g., wheelchair or bedside chair)?: Total Help needed to walk in hospital room?: Total Help needed climbing 3-5 steps with a railing? : Total 6 Click Score: 7    End of Session Equipment Utilized During Treatment: Gait belt Activity Tolerance: Patient tolerated treatment well;Other (comment) (Modified session due to awaiting transfusion, ok for bed to  chair, non-symptomatic) Patient left: in chair;with call bell/phone within reach;with chair alarm set;with family/visitor present Nurse Communication: Mobility status PT Visit Diagnosis: Pain;Unsteadiness on feet (R26.81);Muscle weakness (generalized) (M62.81);Difficulty in walking, not elsewhere classified (R26.2);Other abnormalities of gait and mobility (R26.89) Pain - Right/Left: Right Pain - part of body: Hip     Time: 1351-1430 PT Time Calculation (min) (ACUTE ONLY): 39 min  Charges:    $Gait Training: 8-22 mins $Therapeutic Exercise: 8-22 mins $Therapeutic Activity: 8-22 mins PT General Charges $$ ACUTE PT VISIT: 1 Visit                     {Kiahna Banghart, PTA  Jannet Askew 02/07/2023, 3:48 PM

## 2023-02-08 DIAGNOSIS — S72141A Displaced intertrochanteric fracture of right femur, initial encounter for closed fracture: Secondary | ICD-10-CM | POA: Diagnosis not present

## 2023-02-08 LAB — TYPE AND SCREEN
ABO/RH(D): B POS
Antibody Screen: NEGATIVE
Unit division: 0
Unit division: 0

## 2023-02-08 LAB — GLUCOSE, CAPILLARY
Glucose-Capillary: 174 mg/dL — ABNORMAL HIGH (ref 70–99)
Glucose-Capillary: 252 mg/dL — ABNORMAL HIGH (ref 70–99)
Glucose-Capillary: 214 mg/dL — ABNORMAL HIGH (ref 70–99)
Glucose-Capillary: 178 mg/dL — ABNORMAL HIGH (ref 70–99)

## 2023-02-08 LAB — BPAM RBC
Blood Product Expiration Date: 202409032359
Blood Product Expiration Date: 202409042359
ISSUE DATE / TIME: 202408041010
ISSUE DATE / TIME: 202408061622
Unit Type and Rh: 7300
Unit Type and Rh: 7300

## 2023-02-08 LAB — BASIC METABOLIC PANEL
Anion gap: 8 (ref 5–15)
BUN: 18 mg/dL (ref 8–23)
CO2: 24 mmol/L (ref 22–32)
Calcium: 8.8 mg/dL — ABNORMAL LOW (ref 8.9–10.3)
Chloride: 106 mmol/L (ref 98–111)
Creatinine, Ser: 0.88 mg/dL (ref 0.44–1.00)
GFR, Estimated: >60 mL/min (ref >60)
Glucose, Bld: 188 mg/dL — ABNORMAL HIGH (ref 70–99)
Potassium: 3.6 mmol/L (ref 3.5–5.1)
Sodium: 138 mmol/L (ref 135–145)

## 2023-02-08 LAB — CBC
HCT: 31.1 % — ABNORMAL LOW (ref 36.0–46.0)
Hemoglobin: 10.2 g/dL — ABNORMAL LOW (ref 12.0–15.0)
MCH: 27.6 pg (ref 26.0–34.0)
MCHC: 32.8 g/dL (ref 30.0–36.0)
MCV: 84.3 fL (ref 80.0–100.0)
Platelets: 200 10*3/uL (ref 150–400)
RBC: 3.69 MIL/uL — ABNORMAL LOW (ref 3.87–5.11)
RDW: 16.7 % — ABNORMAL HIGH (ref 11.5–15.5)
WBC: 9.9 10*3/uL (ref 4.0–10.5)
nRBC: 0 % (ref 0.0–0.2)

## 2023-02-08 LAB — OCCULT BLOOD X 1 CARD TO LAB, STOOL: Fecal Occult Bld: NEGATIVE

## 2023-02-08 MED ORDER — MAGNESIUM HYDROXIDE 400 MG/5ML PO SUSP
30.0000 mL | Freq: Once | ORAL | Status: AC
  Administered 2023-02-08: 30 mL via ORAL
  Filled 2023-02-08: qty 30

## 2023-02-08 MED ORDER — ADULT MULTIVITAMIN W/MINERALS CH
1.0000 | ORAL_TABLET | Freq: Every day | ORAL | Status: DC
  Administered 2023-02-08 – 2023-02-09 (×2): 1 via ORAL
  Filled 2023-02-08 (×2): qty 1

## 2023-02-08 MED ORDER — FLEET ENEMA 7-19 GM/118ML RE ENEM
1.0000 | ENEMA | Freq: Once | RECTAL | Status: AC
  Administered 2023-02-08: 1 via RECTAL

## 2023-02-08 MED ORDER — AMLODIPINE BESYLATE 5 MG PO TABS
5.0000 mg | ORAL_TABLET | Freq: Two times a day (BID) | ORAL | Status: DC
  Administered 2023-02-08 (×2): 5 mg via ORAL
  Filled 2023-02-08 (×3): qty 1

## 2023-02-08 NOTE — Plan of Care (Signed)
Problem: Education: Goal: Ability to describe self-care measures that may prevent or decrease complications (Diabetes Survival Skills Education) will improve Outcome: Progressing Goal: Individualized Educational Video(s) Outcome: Progressing   Problem: Coping: Goal: Ability to adjust to condition or change in health will improve Outcome: Progressing   Problem: Fluid Volume: Goal: Ability to maintain a balanced intake and output will improve Outcome: Progressing   Problem: Health Behavior/Discharge Planning: Goal: Ability to identify and utilize available resources and services will improve Outcome: Progressing Goal: Ability to manage health-related needs will improve Outcome: Progressing   Problem: Metabolic: Goal: Ability to maintain appropriate glucose levels will improve Outcome: Progressing   Problem: Nutritional: Goal: Maintenance of adequate nutrition will improve Outcome: Progressing Goal: Progress toward achieving an optimal weight will improve Outcome: Progressing   Problem: Skin Integrity: Goal: Risk for impaired skin integrity will decrease Outcome: Progressing   Problem: Tissue Perfusion: Goal: Adequacy of tissue perfusion will improve Outcome: Progressing   Problem: Education: Goal: Knowledge of General Education information will improve Description: Including pain rating scale, medication(s)/side effects and non-pharmacologic comfort measures Outcome: Progressing   Problem: Health Behavior/Discharge Planning: Goal: Ability to manage health-related needs will improve Outcome: Progressing   Problem: Clinical Measurements: Goal: Ability to maintain clinical measurements within normal limits will improve Outcome: Progressing Goal: Will remain free from infection Outcome: Progressing Goal: Diagnostic test results will improve Outcome: Progressing Goal: Respiratory complications will improve Outcome: Progressing Goal: Cardiovascular complication will  be avoided Outcome: Progressing   Problem: Activity: Goal: Risk for activity intolerance will decrease Outcome: Progressing   Problem: Nutrition: Goal: Adequate nutrition will be maintained Outcome: Progressing   Problem: Coping: Goal: Level of anxiety will decrease Outcome: Progressing   Problem: Elimination: Goal: Will not experience complications related to bowel motility Outcome: Progressing Goal: Will not experience complications related to urinary retention Outcome: Progressing   Problem: Pain Managment: Goal: General experience of comfort will improve Outcome: Progressing   Problem: Safety: Goal: Ability to remain free from injury will improve Outcome: Progressing   Problem: Skin Integrity: Goal: Risk for impaired skin integrity will decrease Outcome: Progressing   Problem: Education: Goal: Ability to describe self-care measures that may prevent or decrease complications (Diabetes Survival Skills Education) will improve Outcome: Progressing Goal: Individualized Educational Video(s) Outcome: Progressing   Problem: Coping: Goal: Ability to adjust to condition or change in health will improve Outcome: Progressing   Problem: Fluid Volume: Goal: Ability to maintain a balanced intake and output will improve Outcome: Progressing   Problem: Health Behavior/Discharge Planning: Goal: Ability to identify and utilize available resources and services will improve Outcome: Progressing Goal: Ability to manage health-related needs will improve Outcome: Progressing   Problem: Metabolic: Goal: Ability to maintain appropriate glucose levels will improve Outcome: Progressing   Problem: Nutritional: Goal: Maintenance of adequate nutrition will improve Outcome: Progressing Goal: Progress toward achieving an optimal weight will improve Outcome: Progressing   Problem: Skin Integrity: Goal: Risk for impaired skin integrity will decrease Outcome: Progressing   Problem:  Tissue Perfusion: Goal: Adequacy of tissue perfusion will improve Outcome: Progressing   Problem: Education: Goal: Knowledge of General Education information will improve Description: Including pain rating scale, medication(s)/side effects and non-pharmacologic comfort measures Outcome: Progressing   Problem: Health Behavior/Discharge Planning: Goal: Ability to manage health-related needs will improve Outcome: Progressing   Problem: Clinical Measurements: Goal: Ability to maintain clinical measurements within normal limits will improve Outcome: Progressing Goal: Will remain free from infection Outcome: Progressing Goal: Diagnostic test results will improve Outcome: Progressing   Goal: Respiratory complications will improve Outcome: Progressing Goal: Cardiovascular complication will be avoided Outcome: Progressing   Problem: Activity: Goal: Risk for activity intolerance will decrease Outcome: Progressing   Problem: Nutrition: Goal: Adequate nutrition will be maintained Outcome: Progressing   Problem: Coping: Goal: Level of anxiety will decrease Outcome: Progressing   Problem: Elimination: Goal: Will not experience complications related to bowel motility Outcome: Progressing Goal: Will not experience complications related to urinary retention Outcome: Progressing   Problem: Pain Managment: Goal: General experience of comfort will improve Outcome: Progressing   Problem: Safety: Goal: Ability to remain free from injury will improve Outcome: Progressing   Problem: Skin Integrity: Goal: Risk for impaired skin integrity will decrease Outcome: Progressing   

## 2023-02-08 NOTE — Progress Notes (Signed)
Physical Therapy Treatment Patient Details Name: Holly Hubbard MRN: [id number] DOB: 05-03-1941 Today's Date: 02/08/2023   History of Present Illness Pt is an 82 y/o F admitted on 02/04/23 after presenting with c/o mechanical fall & pt found to have acute proximal right femoral intertrochanteric fracture. Pt is s/p R IM nail on 02/04/23. 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 Pt found supine in bed with family present (grand daughter). Initial session reading of 94% SpO2 on room air and HR 97 bpm. Pt states pain is currently 9/10 along surgical site; still willing to work with therapy. Pt required Total A to sit EOB with HOB elevated. Pt performed STS to RW with Min A from elevated bed. Pt reports having to use the bathroom, took 2-3 steps to turn towards BSC. Cues given for proper sequencing and controlled descent. Pt was Mod A STS from [facility]. Total A to get pt back in bed. Pt will benefit from continued PT services upon discharge to safely address deficits listed in patient problem list for decreased caregiver assistance and eventual return to PLOF.      If plan is discharge home, recommend the following: Assist for transportation;Assistance with cooking/housework;Direct supervision/assist for financial management;Help with stairs or ramp for entrance;Direct supervision/assist for medications management;Two people to help with walking and/or transfers;Two people to help with bathing/dressing/bathroom   Can travel by private vehicle     No  Equipment Recommendations  None recommended by PT    Recommendations for Other Services       Precautions / Restrictions Precautions Precautions: Fall Restrictions Weight Bearing Restrictions: Yes RLE Weight Bearing: Weight bearing as tolerated     Mobility   Bed Mobility Overal bed mobility: Needs Assistance Bed Mobility: Supine to Sit, Sit to Supine     Supine to sit: Max assist, HOB elevated Sit to supine: Max assist   General bed mobility comments: Pt cues to help as mcu has possible, somewhat apprehensive    Transfers Overall transfer level: Needs assistance Equipment used: Rolling walker (2 wheels) Transfers: Sit to/from Stand Sit to Stand: Mod assist, Min assist           General transfer comment: Min A from elevated EOB, Mod standing from [facility]    Ambulation/Gait               General Gait Details: Ambulation limited this session secondary to pt apprehension and pain levels despite being premedicated. Able to take 2-3 shuffling steps with Min A to advance RLE with VC provided to weight shift. Upon standing pt was fearful of falling.   Stairs             Wheelchair Mobility     Tilt Bed    Modified Rankin (Stroke Patients Only)       Balance Overall balance assessment: Needs assistance Sitting-balance support: Feet supported, No upper extremity supported Sitting balance-Leahy Scale: Good Sitting balance - Comments: Needed assist to adjust hips towards EOB.   Standing balance support: Reliant on assistive device for balance, Bilateral upper extremity supported, During functional activity Standing balance-Leahy Scale: Poor Standing balance comment: heavy reliance on RW                            Cognition Arousal:  Alert Behavior During Therapy: [facility] for tasks assessed/performed Overall Cognitive Status: History of cognitive impairments - at baseline                                 General Comments: Pt pleasant and conversational, able to follow commands as needed, still requires cues for set up and positioning.        Exercises      General Comments        Pertinent Vitals/Pain Pain Assessment Pain Assessment: 0-10 Pain Score: 9  Pain Location: RLE  hip/buttocks Pain Descriptors / Indicators: Discomfort, Guarding, Grimacing, Sore Pain Intervention(s): Limited activity within patient's tolerance, Monitored during session    Home Living                          Prior Function            PT Goals (current goals can now be found in the care plan section) Progress towards PT goals: Progressing toward goals    Frequency    7X/week      PT Plan Current plan remains appropriate    Co-evaluation              AM-PAC PT "6 Clicks" Mobility   Outcome Measure  Help needed turning from your back to your side while in a flat bed without using bedrails?: A Lot Help needed moving from lying on your back to sitting on the side of a flat bed without using bedrails?: Total Help needed moving to and from a bed to a chair (including a wheelchair)?: Total Help needed standing up from a chair using your arms (e.g., wheelchair or bedside chair)?: Total Help needed to walk in hospital room?: Total Help needed climbing 3-5 steps with a railing? : Total 6 Click Score: 7    End of Session Equipment Utilized During Treatment: Gait belt Activity Tolerance: Patient tolerated treatment well;Patient limited by pain Patient left: in bed;with call bell/phone within reach;with bed alarm set;with family/visitor present Nurse Communication: Mobility status PT Visit Diagnosis: Pain;Unsteadiness on feet (R26.81);Muscle weakness (generalized) (M62.81);Difficulty in walking, not elsewhere classified (R26.2);Other abnormalities of gait and mobility (R26.89) Pain - Right/Left: Right Pain - part of body: Hip     Time: 1610-9604 PT Time Calculation (min) (ACUTE ONLY): 30 min  Charges:                            Cecile Sheerer, SPT 02/08/23, 3:19 PM

## 2023-02-08 NOTE — Progress Notes (Signed)
Subjective: 4 Days Post-Op Procedure(s) (LRB): INTRAMEDULLARY (IM) NAIL INTERTROCHANTERIC (Right) Patient reports pain as mild.   Patient seen in rounds with Dr. Ernest Pine. Patient is doing well without any complaints regarding her right hip.  She is up in the chair combing her hair this morning.  States that she was able to get up and move with PT yesterday.  She reports that the itching feeling she was experiencing has improved without any intervention.  Denies any chest pain, N/V, fevers or chills.  States that she has gone to the bathroom.  She is very grateful for all of the help that she has received at her hospital stay. We will continue with therapy today.  Plan is to go Rehab after hospital stay.  Objective: Vital signs in last 24 hours: Temp:  [97.8 F (36.6 C)-99.3 F (37.4 C)] 98.2 F (36.8 C) (08/07 0743) Pulse Rate:  [57-94] 57 (08/07 0743) Resp:  [15-20] 15 (08/07 0743) BP: (100-142)/(49-84) 142/64 (08/07 0743) SpO2:  [96 %-100 %] 100 % (08/07 0743)  Intake/Output from previous day:  Intake/Output Summary (Last 24 hours) at 02/08/2023 0807 Last data filed at 02/07/2023 1930 Gross per 24 hour  Intake 1462 ml  Output --  Net 1462 ml    Intake/Output this shift: No intake/output data recorded.  Labs: Recent Labs    02/05/23 1351 02/06/23 0446 02/07/23 0421  HGB 8.6* 7.5* 7.3*   Recent Labs    02/06/23 0446 02/07/23 0421  WBC 11.2*  --   RBC 2.84*  --   HCT 23.2* 22.3*  PLT 132*  --    Recent Labs    02/06/23 0446 02/07/23 0421  NA 134* 135  K 3.8 3.8  CL 104 105  CO2 21* 24  BUN 23 22  CREATININE 1.01* 1.00  GLUCOSE 192* 159*  CALCIUM 8.3* 8.3*   No results for input(s): "LABPT", "INR" in the last 72 hours.  EXAM General - Patient is Alert, Appropriate, and Oriented Extremity - Neurologically intact ABD soft Neurovascular intact Sensation intact distally Intact pulses distally Dorsiflexion/Plantar flexion intact No cellulitis  present Compartment soft Dressing - dressing C/D/I and no drainage Motor Function - intact, moving foot and toes well on exam.  Patient is able to plantar and dorsiflex with good strength and range of motion of her right foot.  She is also able to wiggle her toes.  Patient had some difficulty performing an SLR, is able to do so with moderate help.  Patient is neurovascularly intact down her right lower extremity to all dermatomes.  Posterior tibial pulses appreciated. 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   Weight as of this encounter: 58.5 kg.  Patient has been running low with her hematocrit and hemoglobin noted on labs.  Previously patient has received 1 unit already.  She received another unit on 02/07/2023 when she was found to have  a hemoglobin of 7.3.  Repeat labs have not been drawn.  Continue to monitor these levels  Continue to hold chemical DVT prophylaxis until hemoglobin stabilizes per Dr. Clemencia Course.  Continues to use SCDs  Patient is weightbearing as tolerated to her right lower extremity.  Continue to work with PT  Aquacel bandage in place with no drainage appreciated.  Patient is looking to transition to a short-term rehab facility  Plan to discharge to SNF with placement from [facility]   Rayburn Go, PA-C [facility] 02/08/2023, 8:07 AM

## 2023-02-08 NOTE — Progress Notes (Signed)
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 She got up from bed and walked to the bathroom with her walker and tripped and fell to her right side. No LOC, chest pain, shortness of breath, confusion. Event was witnessed by her husband.      Assessment and Plan:  Right Hip Fracture secondary to mechanical fall s/p 8/3 ORIF: - Appreciate Orthopedic Surgery assistance and recommendations. -Continue PT/OT - Lovenox for DVT prophylaxis was placed on hold due to acute blood loss anemia requiring blood transfusion. -Will resume Lovenox     Acute Blood Loss Anemia s/p 8/3 ORIF: -Patient noted to have a drop in her H&H from 10 >> 7 -Patient received a total of 2 units of packed RBC with an improvement in her hemoglobin to 10.2    mild AKI, pre-renal: - Renal function is back to baseline     Dementia with behavioral disturbances: Mental status is at baseline. - Continue home Donepezil. - Follow up with regular [facility] Neurologist Dr. Regino Schultze.   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 - Continue Statin.   Diabetes Mellitus Type 2: A1C is 7.3%. Glucose is a little elevated. - Continue sliding scale and monitor POC glucose ACHS. - Patient had diarrhea on Metformin and family requests alternative medication.  We will refer patient to endocrinologist on discharge to discuss alternative diabetes medications.     Essential Hypertension:  Continue amlodipine   Hyperlipidemia: -  Statin.   GERD: - PPI.   Cognitive Dysfunction: - Follow up with [facility].   Chronic Constipation: - Colace and Miralax as needed. Will give a dose of milk of magnesium              Subjective: No new complaints  Physical Exam: Vitals:   02/07/23 1640 02/07/23 1907 02/07/23 2349 02/08/23 0743  BP: (!) 141/52 (!) 100/49 112/70 (!) 142/64  Pulse: 73 65 82 (!) 57  Resp: 16 17 20 15   Temp: 98 F (36.7 C) 97.8 F (36.6 C) 99.3 F (37.4 C) 98.2 F (36.8 C)  TempSrc:      SpO2: 97% 99% 97% 100%  Weight:      Height:       Vitals and nursing note reviewed.  Constitutional:      Comments: Thin and frail  HENT:     Head: Normocephalic and atraumatic.     Nose: Nose normal.     Mouth/Throat:     Mouth: Mucous membranes are moist.  Eyes:     Conjunctiva/sclera: Conjunctivae normal.  Cardiovascular:     Rate and Rhythm: Bradycardia Pulmonary:     Effort: Pulmonary effort is normal.     Breath sounds: Normal breath sounds.  Abdominal:     General: Abdomen is flat. Bowel sounds are normal.     Palpations: Abdomen is soft.  Musculoskeletal:     Cervical back: Normal range of motion and neck supple.     Comments: Decreased range of motion right hip  Skin:    General: Skin is warm and dry.  Neurological:     Mental Status: She is alert and oriented to person, place, and time.     Motor: Weakness present.  Psychiatric:        Mood and Affect: Mood normal.        Behavior: Behavior normal.    Data Reviewed: Labs reviewed. There are no new results to review at this time.  Family Communication: Plan of care discussed with patient's husband  Disposition: Status is: Inpatient Remains inpatient appropriate because: Awaiting discharge to SNF  Planned Discharge Destination: Skilled nursing facility    Time spent: 32  minutes  Author: Lucile Shutters, MD 02/08/2023 12:24 PM  For on call review [email or web address].

## 2023-02-08 NOTE — Progress Notes (Signed)
Nutrition Follow-up  DOCUMENTATION CODES:   Not applicable  INTERVENTION:   -Magic cup BID with meals, each supplement provides 290 kcal and 9 grams of protein  -MVI with minerals daily -Continue carb modified diet  NUTRITION DIAGNOSIS:   Increased nutrient needs related to acute illness (right hip fracture) as evidenced by estimated needs.  Ongoing  GOAL:   Patient will meet greater than or equal to 90% of their needs  Progressing   MONITOR:   PO intake  REASON FOR ASSESSMENT:   Consult Hip fracture protocol  ASSESSMENT:   82 y.o. female with PMH significant for DM, HTN, cognitive deficits, ischemic stroke October 2023 with residual right-sided weakness who presented with a right hip fracture.  8/3- s/p Procedure(s): INTRAMEDULLARY (IM) NAIL INTERTROCHANTERIC (Right)  Reviewed I/O's: +1.5 L x 24 hours and +691 ml since admission  Spoke with pt and granddaughter at bedside. Pt consuming lunch, consumed about 75% of sandwich and ice cream. Per granddaughter, pt typically has a good appetite, consumes 3 meals per day (Breakfast: cereal, Lunch: sandwich, Dinner: meat, starch, and vegetable). Pt doing well with hospital meals, consuming 50-100%.   Pt denies any weight loss. Per granddaughter, pt has a stroke about a year ago and lost weight due to poor oral intake, however, this has resolved. Pt has tried supplements in the past, but denied need for them currently.  Plan to d/c to SNF for short term rehab once medically stable.   No BM since 02/04/23. Fleets enema ordered today.   Medications reviewed and include remeron.   Lab Results  Component Value Date   HGBA1C 7.3 (H) 02/04/2023   PTA DM medications are 1000 mg metformin BID.   Labs reviewed: CBGS: 174-255 (inpatient orders for glycemic control are 0-9 units insulin aspart TID with meals).    NUTRITION - FOCUSED PHYSICAL EXAM:  Flowsheet Row Most Recent Value  Orbital Region No depletion  Upper Arm  Region Mild depletion  Thoracic and Lumbar Region No depletion  Buccal Region No depletion  Temple Region No depletion  Clavicle Bone Region Moderate depletion  Clavicle and Acromion Bone Region No depletion  Scapular Bone Region No depletion  Dorsal Hand Mild depletion  Patellar Region No depletion  Anterior Thigh Region No depletion  Posterior Calf Region No depletion  Edema (RD Assessment) None  Hair Reviewed  Eyes Reviewed  Mouth Reviewed  Skin Reviewed  Nails Reviewed       Diet Order:   Diet Order             Diet Carb Modified Fluid consistency: Thin; Room service appropriate? Yes with Assist  Diet effective now                   EDUCATION NEEDS:   Education needs have been addressed  Skin:  Skin Assessment: Skin Integrity Issues: Skin Integrity Issues:: Incisions Incisions: Right leg  Last BM:  02/04/23  Height:   Ht Readings from Last 1 Encounters:  02/04/23 5\' 6"  (1.676 m)    Weight:   Wt Readings from Last 1 Encounters:  02/04/23 58.5 kg    Ideal Body Weight:  59.1 kg  BMI:  Body mass index is 20.82 kg/m.  Estimated Nutritional Needs:   Kcal:  1650-1750 kcals  Protein:  70-90 grams  Fluid:  >/= 1.6L    Levada Schilling, RD, LDN, CDCES Registered Dietitian II Certified Diabetes Care and Education Specialist Please refer to [facility] for RD and/or RD on-call/weekend/after hours pager

## 2023-02-08 NOTE — Plan of Care (Signed)
  Problem: Education: Goal: Ability to describe self-care measures that may prevent or decrease complications (Diabetes Survival Skills Education) will improve Outcome: Progressing   Problem: Coping: Goal: Ability to adjust to condition or change in health will improve Outcome: Progressing   Problem: Health Behavior/Discharge Planning: Goal: Ability to identify and utilize available resources and services will improve Outcome: Progressing   Problem: Metabolic: Goal: Ability to maintain appropriate glucose levels will improve Outcome: Progressing   Problem: Skin Integrity: Goal: Risk for impaired skin integrity will decrease Outcome: Progressing   Problem: Tissue Perfusion: Goal: Adequacy of tissue perfusion will improve Outcome: Progressing

## 2023-02-08 NOTE — Progress Notes (Signed)
Occupational Therapy Treatment Patient Details Name: Holly Hubbard MRN: [id number] DOB: June 14, 1941 Today's Date: 02/08/2023   History of present illness Pt is an 82 y/o F admitted on 02/04/23 after presenting with c/o mechanical fall & pt found to have acute proximal right femoral intertrochanteric fracture. Pt is s/p R IM nail on 02/04/23. 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 Pt stands from recliner chair with max A secondary to lower surface with RW. Pt turns and takes several steps with RW to [facility] for toileting needs. Pt able to perform hygiene while seated but needing mod A to stand from elevated BSC and assistance for clothing management. Pt fatiguing quickly once up and OT needing to move bed closer to pt to return secondary to pt feeling like she could not take another step. Max A for trunk and B LEs to return to supine. Call bell and all needed items within reach.       If plan is discharge home, recommend the following:  Two people to help with walking and/or transfers;A lot of help with bathing/dressing/bathroom;Assistance with cooking/housework;Direct supervision/assist for medications management;Direct supervision/assist for financial management;Assist for transportation;Help with stairs or ramp for entrance   Equipment Recommendations  Other (comment) (defer to next venue of care)       Precautions / Restrictions Precautions Precautions: Fall Restrictions Weight Bearing Restrictions: Yes RLE Weight Bearing: Weight bearing as tolerated       Mobility Bed Mobility Overal bed mobility: Needs Assistance Bed Mobility: Sit to Supine       Sit to supine: Max assist        Transfers Overall transfer level: Needs assistance Equipment used: Rolling walker (2 wheels) Transfers: Sit to/from Stand,  Bed to chair/wheelchair/BSC Sit to Stand: Mod assist, Max assist     Step pivot transfers: Mod assist           Balance Overall balance assessment: Needs assistance Sitting-balance support: Feet supported, No upper extremity supported Sitting balance-Leahy Scale: Good     Standing balance support: Bilateral upper extremity supported, Reliant on assistive device for balance, During functional activity Standing balance-Leahy Scale: Poor                             ADL either performed or assessed with clinical judgement   ADL Overall ADL's : Needs assistance/impaired                         Toilet Transfer: Moderate assistance;Ambulation;BSC/3in1;Rolling walker (2 wheels)   Toileting- Clothing Manipulation and Hygiene: Moderate assistance;Sit to/from stand              Extremity/Trunk Assessment Upper Extremity Assessment Upper Extremity Assessment: Generalized weakness   Lower Extremity Assessment Lower Extremity Assessment: Generalized weakness        Vision Patient Visual Report: No change from baseline            Cognition Arousal: Alert Behavior During Therapy: WFL for tasks assessed/performed Overall Cognitive Status: History of cognitive impairments - at baseline                                 General Comments: Pt follows simple commands with  increased time but needs cuing for technique and redirection                   Pertinent Vitals/ Pain       Pain Assessment Pain Assessment: Faces Faces Pain Scale: Hurts whole lot Pain Location: RLE hip/buttocks Pain Descriptors / Indicators: Discomfort, Guarding, Grimacing Pain Intervention(s): Monitored during session, Limited activity within patient's tolerance, Repositioned         Frequency  Min 1X/week        Progress Toward Goals  OT Goals(current goals can now be found in the care plan section)  Progress towards OT goals: Progressing toward  goals     Plan Discharge plan remains appropriate;Frequency remains appropriate       AM-PAC OT "6 Clicks" Daily Activity     Outcome Measure   Help from another person eating meals?: None Help from another person taking care of personal grooming?: None Help from another person toileting, which includes using toliet, bedpan, or urinal?: A Lot Help from another person bathing (including washing, rinsing, drying)?: A Lot Help from another person to put on and taking off regular upper body clothing?: A Little Help from another person to put on and taking off regular lower body clothing?: A Lot 6 Click Score: 17    End of Session Equipment Utilized During Treatment: Rolling walker (2 wheels)  OT Visit Diagnosis: Unsteadiness on feet (R26.81);History of falling (Z91.81)   Activity Tolerance Patient limited by pain;Patient tolerated treatment well   Patient Left with call bell/phone within reach;in bed;with bed alarm set   Nurse Communication          Time: 1610-9604 OT Time Calculation (min): 23 min  Charges: OT General Charges $OT Visit: 1 Visit OT Treatments $Self Care/Home Management : 23-37 mins  Jackquline Denmark, MS, OTR/L , CBIS ascom [phone]  02/08/23, 10:21 AM

## 2023-02-09 DIAGNOSIS — S72141A Displaced intertrochanteric fracture of right femur, initial encounter for closed fracture: Secondary | ICD-10-CM | POA: Diagnosis not present

## 2023-02-09 LAB — GLUCOSE, CAPILLARY
Glucose-Capillary: 175 mg/dL — ABNORMAL HIGH (ref 70–99)
Glucose-Capillary: 168 mg/dL — ABNORMAL HIGH (ref 70–99)
Glucose-Capillary: 170 mg/dL — ABNORMAL HIGH (ref 70–99)

## 2023-02-09 MED ORDER — ENOXAPARIN SODIUM 40 MG/0.4ML IJ SOSY: 40.0000 mg | PREFILLED_SYRINGE | INTRAMUSCULAR | 0 refills | Status: DC

## 2023-02-09 MED ORDER — SENNA 8.6 MG PO TABS: 2.0000 | ORAL_TABLET | Freq: Every day | ORAL | 0 refills | Status: AC

## 2023-02-09 MED ORDER — HYDROCODONE-ACETAMINOPHEN 5-325 MG PO TABS: 1.0000 | ORAL_TABLET | Freq: Four times a day (QID) | ORAL | 0 refills | Status: DC | PRN

## 2023-02-09 MED ORDER — ENOXAPARIN SODIUM 40 MG/0.4ML IJ SOSY
40.0000 mg | PREFILLED_SYRINGE | INTRAMUSCULAR | Status: DC
  Administered 2023-02-09: 40 mg via SUBCUTANEOUS
  Filled 2023-02-09: qty 0.4

## 2023-02-09 MED ORDER — GLIPIZIDE 5 MG PO TABS: 5.0000 mg | ORAL_TABLET | Freq: Every day | ORAL | 11 refills | Status: DC

## 2023-02-09 MED ORDER — ACETAMINOPHEN 325 MG PO TABS: 650.0000 mg | ORAL_TABLET | Freq: Four times a day (QID) | ORAL | 0 refills | Status: AC | PRN

## 2023-02-09 MED ORDER — POLYETHYLENE GLYCOL 3350 17 G PO PACK: 17.0000 g | PACK | Freq: Every day | ORAL | 0 refills | Status: AC

## 2023-02-09 NOTE — Progress Notes (Signed)
Physical Therapy Treatment Patient Details Name: Holly Hubbard MRN: [id number] DOB: February 02, 1941 Today's Date: 02/09/2023   History of Present Illness Pt is an 82 y/o F admitted on 02/04/23 after presenting with c/o mechanical fall & pt found to have acute proximal right femoral intertrochanteric fracture. Pt is s/p R IM nail on 02/04/23. 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 She has baseline cognition deficits however was pleasant and able to follow commands throughout. Overall pain limited. Was pre-medicated prior to session. Pt puts forth great effort throughout but continues to require extensive assistance to achieve EOB short sit from supine. Once seated EOB, pt is able to maintain balance with supervision. She required min-mod assist to stand from slightly elevated bed height. Attempted ambulation however pt struggles to lift LLE when wt bearing on RLE+ BUE support. She did take several shuffling, antalgic steps to pivot to recliner. Tolerated there ex well (listed below). Acute PT will continue to follow and progress per current POC. She will benefit from continued skilled PT to maximize independence and safety with all ADLs.    If plan is discharge home, recommend the following: A lot of help with walking and/or transfers;A lot of help with bathing/dressing/bathroom;Assistance with cooking/housework;Direct supervision/assist for medications management;Direct supervision/assist for financial management;Assist for transportation;Help with stairs or ramp for entrance;Supervision due to cognitive status     Equipment Recommendations  Other (comment) (defer to next level of care)       Precautions / Restrictions Precautions Precautions: Fall Restrictions Weight Bearing Restrictions: Yes RLE Weight Bearing: Weight bearing as tolerated     Mobility  Bed  Mobility Overal bed mobility: Needs Assistance Bed Mobility: Supine to Sit  Supine to sit: Max assist, HOB elevated  General bed mobility comments: increased time to perform + max assist. pain most limiting however pt puts forth great effort throughout.    Transfers Overall transfer level: Needs assistance Equipment used: Rolling walker (2 wheels) Transfers: Sit to/from Stand Sit to Stand: Min assist, Mod assist  General transfer comment: Pt was able to stand EOB and take a few sliding/shuffling steps to recliner. unable to clear LLE with RLE wt bearing + BUE support on RW    Ambulation/Gait  General Gait Details: pt unable to clear feet to take steps/ambulate but was able to pivot/shuffling feet to recliner.    Balance Overall balance assessment: Needs assistance Sitting-balance support: Feet supported, No upper extremity supported Sitting balance-Leahy Scale: Good     Standing balance support: Reliant on assistive device for balance, Bilateral upper extremity supported, During functional activity Standing balance-Leahy Scale: Poor Standing balance comment: heavy reliance on RW      Cognition Arousal: Alert Behavior During Therapy: WFL for tasks assessed/performed Overall Cognitive Status: History of cognitive impairments - at baseline    General Comments: poor short term memory however pleasant and able to consistently follow commands. pain limited        Exercises General Exercises - Lower Extremity Ankle Circles/Pumps: AROM, Both, 20 reps, Supine Quad Sets: AROM, 5 reps Gluteal Sets: 10 reps Heel Slides: AAROM, Right, 10 reps, Supine Hip ABduction/ADduction: AAROM, Right, 10 reps, Supine Straight Leg Raises: AAROM, 5 reps        Pertinent Vitals/Pain Pain Assessment Pain Assessment: PAINAD Breathing: normal Negative Vocalization: occasional moan/groan, low speech, negative/disapproving quality Facial Expression: sad, frightened, frown Consolability: no need  to console Pain Location: RLE hip/buttocks/knee Pain Descriptors / Indicators: Discomfort, Guarding, Grimacing, Sore Pain Intervention(s): Limited activity within patient's tolerance, Monitored during session, Premedicated before session, Repositioned     PT Goals (current goals can now be found in the care plan section) Acute Rehab PT Goals Patient Stated Goal: decreased pain, return home when safe/able Progress towards PT goals: Progressing toward goals    Frequency    7X/week      PT Plan Current plan remains appropriate    Co-evaluation     PT goals addressed during session: Mobility/safety with mobility;Balance;Proper use of DME;Strengthening/ROM        AM-PAC PT "6 Clicks" Mobility   Outcome Measure  Help needed turning from your back to your side while in a flat bed without using bedrails?: A Lot Help needed moving from lying on your back to sitting on the side of a flat bed without using bedrails?: A Lot Help needed moving to and from a bed to a chair (including a wheelchair)?: A Lot Help needed standing up from a chair using your arms (e.g., wheelchair or bedside chair)?: A Lot Help needed to walk in hospital room?: Total Help needed climbing 3-5 steps with a railing? : Total 6 Click Score: 10    End of Session   Activity Tolerance: Patient tolerated treatment well;Patient limited by pain Patient left: in chair;with call bell/phone within reach;with chair alarm set;with nursing/sitter in room Nurse Communication: Mobility status PT Visit Diagnosis: Pain;Unsteadiness on feet (R26.81);Muscle weakness (generalized) (M62.81);Difficulty in walking, not elsewhere classified (R26.2);Other abnormalities of gait and mobility (R26.89) Pain - Right/Left: Right Pain - part of body: Hip     Time: 1610-9604 PT Time Calculation (min) (ACUTE ONLY): 17 min  Charges:    $Therapeutic Activity: 8-22 mins PT General Charges $$ ACUTE PT VISIT: 1 Visit                     Jetta Lout PTA 02/09/23, 7:53 AM

## 2023-02-09 NOTE — Progress Notes (Signed)
Per MD hold amlodipine. Pt BP is 119/55 and HR is 52.

## 2023-02-09 NOTE — TOC Progression Note (Signed)
Transition of Care [facility]) - Progression Note    Patient Details  Name: Holly Hubbard MRN: [id number] Date of Birth: 1941/06/24  [facility] of Care [facility]) CM/SW Contact  Garret Reddish, RN Phone Number: 02/09/2023, 6:49 AM  Clinical Narrative:     Meet with patient at bedside side on yesterday.  Informed her of bed offers.  She has accepted [facility].  I attempted to call patient's husband and make him aware.  [facility] can accept on Thursday.    TOC will continue to follow for discharge planning.     Expected Discharge Plan: Skilled Nursing Facility Barriers to Discharge: No Barriers Identified  Expected Discharge Plan and Services In-house Referral:  (Patient has a walker and a rollator at home and wheelchair at home.)   Post Acute Care Choice: Skilled Nursing Facility                                         Social Determinants of Health (SDOH) Interventions SDOH Screenings   Food Insecurity: No Food Insecurity (02/04/2023)  Housing: Patient Declined (02/04/2023)  Transportation Needs: No Transportation Needs (02/04/2023)  Utilities: Not At Risk (02/04/2023)  Tobacco Use: Low Risk  (02/02/2023)   Received from [facility]    Readmission Risk Interventions     No data to display

## 2023-02-09 NOTE — Progress Notes (Signed)
Subjective: 5 Days Post-Op Procedure(s) (LRB): INTRAMEDULLARY (IM) NAIL INTERTROCHANTERIC (Right) Patient reports pain as mild.   Patient seen in rounds with Dr. Ernest Pine. Patient is doing well without any complaints regarding her right hip.  She was initially utilizing her bedside commode during provider visit.  Upon revisit, she states that she was able to get up and move with PT yesterday and that she is doing better and better each day.  Denies any chest pain, N/V, fevers or chills.  We will continue with therapy today.  Plan is to go Rehab after hospital stay.  Objective: Vital signs in last 24 hours: Temp:  [98 F (36.7 C)-98.7 F (37.1 C)] 98 F (36.7 C) (08/08 0853) Pulse Rate:  [42-83] 59 (08/08 1013) Resp:  [15-16] 15 (08/08 1013) BP: (93-130)/(50-79) 119/55 (08/08 1013) SpO2:  [94 %-99 %] 99 % (08/08 1013)  Intake/Output from previous day:  Intake/Output Summary (Last 24 hours) at 02/09/2023 1417 Last data filed at 02/08/2023 1906 Gross per 24 hour  Intake 240 ml  Output --  Net 240 ml    Intake/Output this shift: No intake/output data recorded.  Labs: Recent Labs    02/07/23 0421 02/08/23 0904  HGB 7.3* 10.2*   Recent Labs    02/07/23 0421 02/08/23 0904  WBC  --  9.9  RBC  --  3.69*  HCT 22.3* 31.1*  PLT  --  200   Recent Labs    02/07/23 0421 02/08/23 0904  NA 135 138  K 3.8 3.6  CL 105 106  CO2 24 24  BUN 22 18  CREATININE 1.00 0.88  GLUCOSE 159* 188*  CALCIUM 8.3* 8.8*   No results for input(s): "LABPT", "INR" in the last 72 hours.  EXAM General - Patient is Alert, Appropriate, and Oriented Extremity - Neurologically intact ABD soft Neurovascular intact Sensation intact distally Intact pulses distally Dorsiflexion/Plantar flexion intact No cellulitis present Compartment soft Dressing - dressing C/D/I and no drainage Motor Function - intact, moving foot and toes well on exam.  Patient is able to plantar and dorsiflex with good strength  and range of motion of her right foot.  She is also able to wiggle her toes.  Patient had some difficulty performing an SLR, is able to do so with moderate help.  Patient is neurovascularly intact down her right lower extremity to all dermatomes.  Posterior tibial pulses appreciated. 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   Weight as of this encounter: 58.5 kg.  Patient has been running low with her hematocrit and hemoglobin noted on labs.  Previously patient has received 1 unit already.  She received another unit on 02/07/2023 when she was found to have a hemoglobin of 7.3.  Repeat labs drawn on 02/08/2023 showed that she had a hemoglobin of 10.2.  Patient is weightbearing as tolerated to her right lower extremity.  Continue to  work with PT  Patient has been using enoxaparin for DVT prophylaxis  Aquacel bandage in place with no drainage appreciated.  Patient is looking to transition to a short-term rehab facility  [facility] placement at [facility].  Plan to discharge today   Rayburn Go, PA-C [facility] 02/09/2023, 2:17 PM

## 2023-02-09 NOTE — Progress Notes (Signed)
   02/09/23 1600  Spiritual Encounters  Type of Visit Initial  Care provided to: Patient  Referral source Chaplain assessment  Reason for visit Routine spiritual support  OnCall Visit No  Spiritual Framework  Presenting Themes Courage hope and growth  Interventions  Spiritual Care Interventions Made Established relationship of care and support;Compassionate presence;Reflective listening;Encouragement  Intervention Outcomes  Outcomes Awareness of support;Awareness around self/spiritual resourses  Spiritual Care Plan  Spiritual Care Issues Still Outstanding No further spiritual care needs at this time (see row info)   Chaplain visited patient with compassionate care. Patient is excited to go home soon. Chaplain spiritual support services remain available as the need arises.

## 2023-02-09 NOTE — TOC Progression Note (Signed)
Transition of Care [facility]) - Progression Note    Patient Details  Name: Holly Hubbard MRN: [id number] Date of Birth: [birth year]  Transition of Care [facility]) CM/SW Contact  Garret Reddish, RN Phone Number: 02/09/2023, 1:34 PM  Clinical Narrative:   Chart reviewed.  Sue Lush from [facility] informs me that patient will be going to room 119 and the number to call report is [phone].    I have informed staff nurse of the above information.      Expected Discharge Plan: Skilled Nursing Facility Barriers to Discharge: No Barriers Identified  Expected Discharge Plan and Services In-house Referral:  (Patient has a walker and a rollator at home and wheelchair at home.)   Post Acute Care Choice: Skilled Nursing Facility   Expected Discharge Date: 02/09/23                                     Social Determinants of Health (SDOH) Interventions SDOH Screenings   Food Insecurity: No Food Insecurity (02/04/2023)  Housing: Patient Declined (02/04/2023)  Transportation Needs: No Transportation Needs (02/04/2023)  Utilities: Not At Risk (02/04/2023)  Tobacco Use: Low Risk  (02/02/2023)   Received from [facility]    Readmission Risk Interventions     No data to display

## 2023-02-09 NOTE — Discharge Summary (Signed)
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 *  Hospital Course:  Holly Hubbard is a 82 y.o. female with medical history significant for DM, HTN, cognitive deficits, ischemic stroke October 2023 in [place], with residual right-sided weakness, followed by neurology at [facility] who presents to the ED with a right hip fracture.  Most of the history is given by the husband at the bedside who stated that patient had been having loose stool related to an increase in her metformin dose and has been getting up to use the bathroom at night.  On the night of arrival she was maneuvering her walker to the bathroom when she misstepped falling onto the right hip.  She was previously in her usual state of health. ED course and data review: BP 187/73 with otherwise normal vitals labs notable for WBC of 12,000, hemoglobin of 10, creatinine 1.06 EKG pending Chest x-ray nonacute X-ray hip shows acute intertrochanteric fracture of the right hip with superior subluxation along with minimally displaced fractures of the right superior and inferior pubic rami. The ED provider spoke with orthopedist Dr Allie Bossier who would like to take patient to the OR later in the afternoon Patient was treated with fentanyl Hospitalist  consulted for admission.     Assessment and Plan:  Right Hip Fracture secondary to mechanical fall s/p 8/3 ORIF: - Appreciate Orthopedic Surgery assistance and recommendations. -Patient will be discharged to skilled nursing facility for continued PT/OT -Continue Lovenox for DVT prophylaxis      Acute Blood Loss Anemia s/p 8/3 ORIF: -Patient noted to have a drop in her H&H from 10 >> 7 -Patient received a total of 2 units of packed RBC with an improvement in her hemoglobin to 10.2  -Stool for occult blood is negative -Resume Plavix and Lovenox     mild AKI, pre-renal: - Renal function is back to baseline     Dementia with behavioral disturbances: Mental status is at baseline. - Continue home Donepezil. - Follow up with regular [facility] Neurologist Dr. Regino Schultze.   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    Diabetes Mellitus Type 2: A1C is 7.3%.  - Patient had diarrhea on Metformin and family requests alternative medication.   -Will discharge patient on glipizide 5 mg daily -Check blood sugars daily -Follow-up with endocrinology as an outpatient   Essential Hypertension:  Continue amlodipine     GERD: - PPI.   Cognitive Dysfunction: - Follow up with [facility].   Chronic Constipation: - Colace and Miralax as needed. Will give a dose of milk of magnesium            Consultants: Orthopedic surgery Procedures performed: Intramedullary nail intertrochanteric, right hip Disposition: Skilled nursing facility Diet recommendation:  Discharge Diet Orders (From admission, onward)     Start  Ordered   02/09/23 0000  Diet - low sodium heart healthy        02/09/23 1113   02/09/23 0000  Diet Carb Modified        02/09/23 1113           Carb modified diet DISCHARGE MEDICATION: Allergies as of 02/09/2023   No Known Allergies      Medication List     STOP taking these medications     metFORMIN 500 MG tablet Commonly known as: GLUCOPHAGE       TAKE these medications    acetaminophen 325 MG tablet Commonly known as: TYLENOL Take 2 tablets (650 mg total) by mouth every 6 (six) hours as needed for mild pain.   amLODipine 5 MG tablet Commonly known as: NORVASC Take 5 mg by mouth 2 (two) times daily.   atorvastatin 80 MG tablet Commonly known as: LIPITOR Take 80 mg by mouth daily.   Calcium Carb-Cholecalciferol 500-5 MG-MCG Tabs Take 1 tablet by mouth 2 (two) times daily with a meal.   clopidogrel 75 MG tablet Commonly known as: PLAVIX Take 1 tablet by mouth daily.   donepezil 10 MG tablet Commonly known as: ARICEPT Take 10 mg by mouth at bedtime.   enoxaparin 40 MG/0.4ML injection Commonly known as: LOVENOX Inject 0.4 mLs (40 mg total) into the skin daily for 14 days. Start taking on: February 10, 2023   glipiZIDE 5 MG tablet Commonly known as: GLUCOTROL Take 1 tablet (5 mg total) by mouth daily before breakfast.   HYDROcodone-acetaminophen 5-325 MG tablet Commonly known as: NORCO/VICODIN Take 1 tablet by mouth every 6 (six) hours as needed for up to 5 days for moderate pain.   mirtazapine 15 MG disintegrating tablet Commonly known as: REMERON SOL-TAB Take 15 mg by mouth at bedtime.   pantoprazole 20 MG tablet Commonly known as: PROTONIX Take 1 tablet by mouth daily.   polyethylene glycol 17 g packet Commonly known as: MIRALAX / GLYCOLAX Take 17 g by mouth daily.   senna 8.6 MG Tabs tablet Commonly known as: SENOKOT Take 2 tablets (17.2 mg total) by mouth at bedtime.   tamsulosin 0.4 MG Caps capsule Commonly known as: FLOMAX Take 1 capsule by mouth daily.   telmisartan 80 MG tablet Commonly known as: MICARDIS Take 1 tablet by mouth daily.        Discharge Exam: Filed Weights   02/04/23 0310  Weight: 58.5 kg    Vitals and nursing note reviewed.  Constitutional:      Comments: Thin and frail  HENT:     Head:  Normocephalic and atraumatic.     Nose: Nose normal.     Mouth/Throat:     Mouth: Mucous membranes are moist.  Eyes:     Conjunctiva/sclera: Conjunctivae normal.  Cardiovascular:     Rate and Rhythm: Bradycardia Pulmonary:     Effort: Pulmonary effort is normal.     Breath sounds: Normal breath sounds.  Abdominal:     General: Abdomen is flat. Bowel sounds are normal.     Palpations: Abdomen is soft.  Musculoskeletal:     Cervical back: Normal range of motion and neck supple.     Comments: Decreased range of motion right hip  Skin:    General: Skin is warm and dry.  Neurological:     Mental Status: She is alert and oriented to person, place, and time.     Motor: Weakness present.  Psychiatric:  Mood and Affect: Mood normal.        Behavior: Behavior normal.     Condition at discharge: stable  The results of significant diagnostics from this hospitalization (including imaging, microbiology, ancillary and laboratory) are listed below for reference.   Imaging Studies: DG FEMUR PORT, MIN 2 VIEWS RIGHT  Result Date: 02/04/2023 CLINICAL DATA:  Postoperative state. Right femoral intramedullary nail. EXAM: RIGHT FEMUR PORTABLE 2 VIEW COMPARISON:  Pelvis and right hip radiographs 02/04/2023 FINDINGS: There is diffuse decreased bone mineralization. Interval long cephalomedullary nail fixation of the previously seen acute proximal right femoral intertrochanteric fracture. There is improved, now near anatomic alignment. Mild right femoroacetabular joint space narrowing. Moderate pubic symphysis and mild sacroiliac osteoarthritis. Prior total right knee arthroplasty. No evidence of hardware complication. Moderate to high-grade atherosclerotic calcifications. Expected postoperative subcutaneous air lateral to the right pelvis and proximal femur. Nondisplaced superior and undisplaced inferior right pubic ramus fractures are again noted. IMPRESSION: Interval long cephalomedullary nail  fixation of the previously seen acute proximal right femoral intertrochanteric fracture. Improved, now near anatomic alignment. Electronically Signed   By: Neita Garnet M.D.   On: 02/04/2023 17:27   DG HIP UNILAT WITH PELVIS 2-3 VIEWS RIGHT  Result Date: 02/04/2023 CLINICAL DATA:  Elective surgery. EXAM: DG HIP (WITH OR WITHOUT PELVIS) 2-3V RIGHT COMPARISON:  Preoperative radiograph. FINDINGS: Four fluoroscopic spot views of the right hip and femur obtained in the operating room. Femoral intramedullary nail with trans trochanteric and distal locking screw fixation of intertrochanteric femur fracture. Right pubic rami fractures are faintly visualized. Fluoroscopy time 1 minutes 30 seconds. Dose 9.42 mGy. IMPRESSION: Intraoperative fluoroscopy during ORIF of right intertrochanteric femur fracture. Electronically Signed   By: Narda Rutherford M.D.   On: 02/04/2023 16:34   DG C-Arm 1-60 Min-No Report  Result Date: 02/04/2023 Fluoroscopy was utilized by the requesting physician.  No radiographic interpretation.   DG Chest Portable 1 View  Result Date: 02/04/2023 CLINICAL DATA:  Initial evaluation for acute trauma, fall, hip fracture. EXAM: PORTABLE CHEST 1 VIEW COMPARISON:  None Available. FINDINGS: Cardiomegaly. Mediastinal silhouette within normal limits. Aortic atherosclerosis. Lungs normally inflated. Mild chronic coarsening of the interstitial markings. Linear atelectasis versus scarring noted at the left lung base. Small calcified granuloma noted at the right lung base. No focal infiltrates. No edema or effusion. No pneumothorax. No acute osseous finding.  Osteopenia noted. IMPRESSION: 1. No radiographic evidence for active cardiopulmonary disease. 2. Cardiomegaly without edema. Aortic Atherosclerosis (ICD10-I70.0). Electronically Signed   By: Rise Mu M.D.   On: 02/04/2023 03:54   DG Hip Unilat W or Wo Pelvis 2-3 Views Right  Result Date: 02/04/2023 CLINICAL DATA:  Initial evaluation for  acute trauma, fall, hip pain. EXAM: DG HIP (WITH OR WITHOUT PELVIS) 2-3V RIGHT COMPARISON:  None Available. FINDINGS: Acute fracture involving the inter trochanteric right hip with superior subluxation. Femoral head remains in normal alignment with the acetabulum. There are associated acute minimally displaced fractures of the right superior and inferior pubic rami. Remainder of the bony pelvis otherwise intact. No abnormality about the contralateral left hip. Underlying osteopenia. Lower lumbar spondylosis noted. Scattered vascular calcifications noted. No other visible soft tissue abnormality. IMPRESSION: 1. Acute intertrochanteric fracture of the right hip with superior subluxation. 2. Acute minimally displaced fractures of the right superior and inferior pubic rami. Electronically Signed   By: Rise Mu M.D.   On: 02/04/2023 03:52    Microbiology: No results found for this or any previous visit.  Labs: CBC:  Recent Labs  Lab 02/04/23 0316 02/05/23 0443 02/05/23 1351 02/06/23 0446 02/07/23 0421 02/08/23 0904  WBC 12.1* 10.4  --  11.2*  --  9.9  NEUTROABS 6.1  --   --   --   --   --   HGB 10.1* 7.0* 8.6* 7.5* 7.3* 10.2*  HCT 31.7* 21.3* 26.6* 23.2* 22.3* 31.1*  MCV 85.0 84.2  --  81.7  --  84.3  PLT 233 171  --  132*  --  200   Basic Metabolic Panel: Recent Labs  Lab 02/05/23 0443 02/05/23 1257 02/06/23 0446 02/07/23 0421 02/08/23 0904  NA 134* 136 134* 135 138  K 3.8 4.0 3.8 3.8 3.6  CL 106 102 104 105 106  CO2 23 23 21* 24 24  GLUCOSE 235* 215* 192* 159* 188*  BUN 27* 28* 23 22 18   CREATININE 1.22* 1.33* 1.01* 1.00 0.88  CALCIUM 8.1* 8.4* 8.3* 8.3* 8.8*   Liver Function Tests: No results for input(s): "AST", "ALT", "ALKPHOS", "BILITOT", "PROT", "ALBUMIN" in the last 168 hours. CBG: Recent Labs  Lab 02/08/23 0737 02/08/23 1145 02/08/23 1603 02/08/23 2119 02/09/23 0743  GLUCAP 174* 252* 214* 178* 175*    Discharge time spent: greater than 30  minutes.  Signed: Lucile Shutters, MD [facility] 02/09/2023

## 2023-02-09 NOTE — TOC Transition Note (Signed)
Transition of Care [facility]) - CM/SW Discharge Note   Patient Details  Name: Holly Hubbard MRN: [id number] Date of Birth: 1940-07-26  Transition of Care [facility]) CM/SW Contact:  Garret Reddish, RN Phone Number: 02/09/2023, 12:44 PM   Clinical Narrative:   Chart reviewed.  Noted that patient has orders for discharge today.   I have been informed by Sue Lush with [facility] that patient will be able to come to the [facility] today. I have sent [facility] patient's Discharge Summary, Discharge Orders, and SNF Transfer Summary.   Sue Lush will let me know the room number and number to call report once she reviews discharge summary.    I have informed Mr. And Mrs. Milbauer that patient will be transported by [facility] to the facility today.    I will arranged EMS transport via [facility] once I receive room number and number to call report.   I have informed staff nurse of the above information.       Final next level of care: Skilled Nursing Facility Barriers to Discharge: No Barriers Identified   Patient Goals and CMS Choice CMS Medicare.gov Compare Post Acute Care list provided to:: Patient Choice offered to / list presented to : Patient  Discharge Placement                Patient chooses bed at: [facility] Patient to be transferred to facility by: [facility] Name of family member notified: Clearance Coots Patient and family notified of of transfer: 02/09/23  Discharge Plan and Services Additional resources added to the After Visit Summary for   In-house Referral:  (Patient has a walker and a rollator at home and wheelchair at home.)   Post Acute Care Choice: Skilled Nursing Facility                               Social Determinants of Health (SDOH) Interventions SDOH Screenings   Food Insecurity: No Food Insecurity (02/04/2023)  Housing: Patient Declined (02/04/2023)  Transportation Needs: No Transportation Needs (02/04/2023)  Utilities:  Not At Risk (02/04/2023)  Tobacco Use: Low Risk  (02/02/2023)   Received from [facility]     Readmission Risk Interventions     No data to display

## 2023-02-09 NOTE — Plan of Care (Signed)

## 2023-02-09 NOTE — Plan of Care (Signed)
  Problem: Nutritional: Goal: Maintenance of adequate nutrition will improve Outcome: Progressing   Problem: Health Behavior/Discharge Planning: Goal: Ability to manage health-related needs will improve Outcome: Progressing   Problem: Clinical Measurements: Goal: Diagnostic test results will improve Outcome: Progressing Goal: Cardiovascular complication will be avoided Outcome: Progressing   Problem: Activity: Goal: Risk for activity intolerance will decrease Outcome: Progressing   Problem: Nutrition: Goal: Adequate nutrition will be maintained Outcome: Progressing   Problem: Elimination: Goal: Will not experience complications related to bowel motility Outcome: Progressing Goal: Will not experience complications related to urinary retention Outcome: Progressing   Problem: Pain Managment: Goal: General experience of comfort will improve Outcome: Progressing

## 2023-02-09 NOTE — TOC Progression Note (Signed)
Transition of Care [facility]) - Progression Note    Patient Details  Name: Holly Hubbard MRN: [id number] Date of Birth: 27-Apr-1941  [facility] Care [facility] CM/SW Contact  Darleene Cleaver, [place] Phone Number: 02/09/2023, 3:15pm  Clinical Narrative:     CSW contacted [facility] [phone] to make sure patient was on the schedule to go to [facility].  Per [facility] she is still on the list.  They did not specify how many people were ahead of her.     Expected Discharge Plan: Skilled Nursing Facility Barriers to Discharge: No Barriers Identified  Expected Discharge Plan and Services In-house Referral:  (Patient has a walker and a rollator at home and wheelchair at home.)   Post Acute Care Choice: Skilled Nursing Facility   Expected Discharge Date: 02/09/23                                     Social Determinants of Health (SDOH) Interventions SDOH Screenings   Food Insecurity: No Food Insecurity (02/04/2023)  Housing: Patient Declined (02/04/2023)  Transportation Needs: No Transportation Needs (02/04/2023)  Utilities: Not At Risk (02/04/2023)  Tobacco Use: Low Risk  (02/02/2023)   Received from [facility]    Readmission Risk Interventions     No data to display

## 2023-02-10 ENCOUNTER — Encounter: Payer: Self-pay | Admitting: Adult Health

## 2023-02-10 ENCOUNTER — Non-Acute Institutional Stay (SKILLED_NURSING_FACILITY): Payer: Medicare Other | Admitting: Adult Health

## 2023-02-10 ENCOUNTER — Other Ambulatory Visit: Payer: Self-pay | Admitting: Adult Health

## 2023-02-10 DIAGNOSIS — I69351 Hemiplegia and hemiparesis following cerebral infarction affecting right dominant side: Secondary | ICD-10-CM

## 2023-02-10 DIAGNOSIS — D62 Acute posthemorrhagic anemia: Secondary | ICD-10-CM

## 2023-02-10 DIAGNOSIS — S72141S Displaced intertrochanteric fracture of right femur, sequela: Secondary | ICD-10-CM

## 2023-02-10 DIAGNOSIS — I10 Essential (primary) hypertension: Secondary | ICD-10-CM

## 2023-02-10 DIAGNOSIS — S32591S Other specified fracture of right pubis, sequela: Secondary | ICD-10-CM

## 2023-02-10 DIAGNOSIS — E1169 Type 2 diabetes mellitus with other specified complication: Secondary | ICD-10-CM

## 2023-02-10 DIAGNOSIS — F0390 Unspecified dementia without behavioral disturbance: Secondary | ICD-10-CM

## 2023-02-10 MED ORDER — HYDROCODONE-ACETAMINOPHEN 5-325 MG PO TABS: 1.0000 | ORAL_TABLET | Freq: Four times a day (QID) | ORAL | 0 refills | Status: DC | PRN

## 2023-02-10 NOTE — Progress Notes (Unsigned)
Location:  [facility] Room Number: 119 A Place of Service:  SNF (31) Provider:  Kenard Gower, DNP, FNP-BC  Patient Care Team: Ardyth Gal, MD as PCP - General (Internal Medicine)  Extended Emergency Contact Information Primary Emergency Contact: Lynann Beaver [place] Home Phone: [phone] Relation: Spouse Secondary Emergency Contact: [place],SHANNON Mobile Phone: [phone] Relation: Daughter  Code Status:  FULL CODE  Goals of care: Advanced Directive information    02/10/2023   10:42 AM  Advanced Directives  Does Patient Have a Medical Advance Directive? No     Chief Complaint  Patient presents with   Hospitalization Follow-up    Hospital follow up    HPI:  Pt is a 82 y.o. Holly Hubbard seen today for medical management of chronic diseases.  ***   Past Medical History:  Diagnosis Date   History of ischemic stroke    Past Surgical History:  Procedure Laterality Date   INTRAMEDULLARY (IM) NAIL INTERTROCHANTERIC Right 02/04/2023   Procedure: INTRAMEDULLARY (IM) NAIL INTERTROCHANTERIC;  Surgeon: Campbell Stall, MD;  Location: [facility] ORS;  Service: Orthopedics;  Laterality: Right;    No Known Allergies  Outpatient Encounter Medications as of 02/10/2023  Medication Sig   acetaminophen (TYLENOL) 325 MG tablet Take 2 tablets (650 mg total) by mouth every 6 (six) hours as needed for mild pain.   amLODipine (NORVASC) 5 MG tablet Take 5 mg by mouth 2 (two) times daily.   atorvastatin (LIPITOR) 80 MG tablet Take 80 mg by mouth daily.   Calcium Carb-Cholecalciferol 500-5 MG-MCG TABS Take 1 tablet by mouth 2 (two) times daily with a meal.   clopidogrel (PLAVIX) 75 MG tablet Take 1 tablet by mouth daily.   donepezil (ARICEPT) 10 MG tablet Take 10 mg by mouth at bedtime.   enoxaparin (LOVENOX) 40 MG/0.4ML injection Inject 0.4 mLs (40 mg total) into the skin daily for 14 days.   glipiZIDE (GLUCOTROL) 5  MG tablet Take 1 tablet (5 mg total) by mouth daily before breakfast.   HYDROcodone-acetaminophen (NORCO/VICODIN) 5-325 MG tablet Take 1 tablet by mouth every 6 (six) hours as needed for up to 5 days for moderate pain.   mirtazapine (REMERON SOL-TAB) 15 MG disintegrating tablet Take 15 mg by mouth at bedtime.   pantoprazole (PROTONIX) 20 MG tablet Take 1 tablet by mouth daily.   polyethylene glycol (MIRALAX / GLYCOLAX) 17 g packet Take 17 g by mouth daily.   senna (SENOKOT) 8.6 MG TABS tablet Take 2 tablets (17.2 mg total) by mouth at bedtime.   tamsulosin (FLOMAX) 0.4 MG CAPS capsule Take 1 capsule by mouth daily.   telmisartan (MICARDIS) 80 MG tablet Take 1 tablet by mouth daily.   No facility-administered encounter medications on file as of 02/10/2023.    Review of Systems ***     There is no immunization history on file for this patient. Pertinent  Health Maintenance Due  Topic Date Due   FOOT EXAM  Never done   OPHTHALMOLOGY EXAM  Never done   DEXA SCAN  Never done   INFLUENZA VACCINE  02/02/2023   HEMOGLOBIN A1C  08/07/2023       No data to display           Vitals:   02/10/23 1040  BP: 124/66  Pulse: 76  Resp: 16  Temp: 98.2 F (36.8 C)  SpO2: 94%  Weight: 134 lb 8 oz (61 kg)  Height: 5\' 6"  (1.676 m)  Body mass index is 21.71 kg/m.  Physical Exam     Labs reviewed: Recent Labs    02/06/23 0446 02/07/23 0421 02/08/23 0904  NA 134* 135 138  K 3.8 3.8 3.6  CL 104 105 106  CO2 21* 24 24  GLUCOSE 192* 159* 188*  BUN 23 22 18   CREATININE 1.01* 1.00 0.88  CALCIUM 8.3* 8.3* 8.8*   No results for input(s): "AST", "ALT", "ALKPHOS", "BILITOT", "PROT", "ALBUMIN" in the last 8760 hours. Recent Labs    02/04/23 0316 02/05/23 0443 02/05/23 1351 02/06/23 0446 02/07/23 0421 02/08/23 0904  WBC 12.1* 10.4  --  11.2*  --  9.9  NEUTROABS 6.1  --   --   --   --   --   HGB 10.1* 7.0*   < > 7.5* 7.3* 10.2*  HCT 31.7* 21.3*   < > 23.2* 22.3* 31.1*  MCV  85.0 84.2  --  81.7  --  84.3  PLT 233 171  --  132*  --  200   < > = values in this interval not displayed.   No results found for: "TSH" Lab Results  Component Value Date   HGBA1C 7.3 (H) 02/04/2023   No results found for: "CHOL", "HDL", "LDLCALC", "LDLDIRECT", "TRIG", "CHOLHDL"  Significant Diagnostic Results in last 30 days:  DG FEMUR PORT, MIN 2 VIEWS RIGHT  Result Date: 02/04/2023 CLINICAL DATA:  Postoperative state. Right femoral intramedullary nail. EXAM: RIGHT FEMUR PORTABLE 2 VIEW COMPARISON:  Pelvis and right hip radiographs 02/04/2023 FINDINGS: There is diffuse decreased bone mineralization. Interval long cephalomedullary nail fixation of the previously seen acute proximal right femoral intertrochanteric fracture. There is improved, now near anatomic alignment. Mild right femoroacetabular joint space narrowing. Moderate pubic symphysis and mild sacroiliac osteoarthritis. Prior total right knee arthroplasty. No evidence of hardware complication. Moderate to high-grade atherosclerotic calcifications. Expected postoperative subcutaneous air lateral to the right pelvis and proximal femur. Nondisplaced superior and undisplaced inferior right pubic ramus fractures are again noted. IMPRESSION: Interval long cephalomedullary nail fixation of the previously seen acute proximal right femoral intertrochanteric fracture. Improved, now near anatomic alignment. Electronically Signed   By: Neita Garnet M.D.   On: 02/04/2023 17:27   DG HIP UNILAT WITH PELVIS 2-3 VIEWS RIGHT  Result Date: 02/04/2023 CLINICAL DATA:  Elective surgery. EXAM: DG HIP (WITH OR WITHOUT PELVIS) 2-3V RIGHT COMPARISON:  Preoperative radiograph. FINDINGS: Four fluoroscopic spot views of the right hip and femur obtained in the operating room. Femoral intramedullary nail with trans trochanteric and distal locking screw fixation of intertrochanteric femur fracture. Right pubic rami fractures are faintly visualized. Fluoroscopy time 1  minutes 30 seconds. Dose 9.42 mGy. IMPRESSION: Intraoperative fluoroscopy during ORIF of right intertrochanteric femur fracture. Electronically Signed   By: Narda Rutherford M.D.   On: 02/04/2023 16:34   DG C-Arm 1-60 Min-No Report  Result Date: 02/04/2023 Fluoroscopy was utilized by the requesting physician.  No radiographic interpretation.   DG Chest Portable 1 View  Result Date: 02/04/2023 CLINICAL DATA:  Initial evaluation for acute trauma, fall, hip fracture. EXAM: PORTABLE CHEST 1 VIEW COMPARISON:  None Available. FINDINGS: Cardiomegaly. Mediastinal silhouette within normal limits. Aortic atherosclerosis. Lungs normally inflated. Mild chronic coarsening of the interstitial markings. Linear atelectasis versus scarring noted at the left lung base. Small calcified granuloma noted at the right lung base. No focal infiltrates. No edema or effusion. No pneumothorax. No acute osseous finding.  Osteopenia noted. IMPRESSION: 1. No radiographic evidence for active cardiopulmonary disease. 2. Cardiomegaly  without edema. Aortic Atherosclerosis (ICD10-I70.0). Electronically Signed   By: Rise Mu M.D.   On: 02/04/2023 03:54   DG Hip Unilat W or Wo Pelvis 2-3 Views Right  Result Date: 02/04/2023 CLINICAL DATA:  Initial evaluation for acute trauma, fall, hip pain. EXAM: DG HIP (WITH OR WITHOUT PELVIS) 2-3V RIGHT COMPARISON:  None Available. FINDINGS: Acute fracture involving the inter trochanteric right hip with superior subluxation. Femoral head remains in normal alignment with the acetabulum. There are associated acute minimally displaced fractures of the right superior and inferior pubic rami. Remainder of the bony pelvis otherwise intact. No abnormality about the contralateral left hip. Underlying osteopenia. Lower lumbar spondylosis noted. Scattered vascular calcifications noted. No other visible soft tissue abnormality. IMPRESSION: 1. Acute intertrochanteric fracture of the right hip with superior  subluxation. 2. Acute minimally displaced fractures of the right superior and inferior pubic rami. Electronically Signed   By: Rise Mu M.D.   On: 02/04/2023 03:52    Assessment/Plan ***   Family/ staff Communication: Discussed plan of care with resident and charge nurse  Labs/tests ordered:     Kenard Gower, DNP, MSN, FNP-BC [facility] [phone] (Monday-Friday 8:00 a.m. - 5:00 p.m.) [phone] (after hours)

## 2023-02-13 LAB — CBC AND DIFFERENTIAL
HCT: 28 — AB (ref 36–46)
Hemoglobin: 8.8 — AB (ref 12.0–16.0)
Neutrophils Absolute: 5273
Platelets: 330 10*3/uL (ref 150–400)
WBC: 9.3

## 2023-02-13 LAB — BASIC METABOLIC PANEL
BUN: 22 — AB (ref 4–21)
CO2: 27 — AB (ref 13–22)
Chloride: 104 (ref 99–108)
Creatinine: 0.9 (ref 0.5–1.1)
Glucose: 143
Potassium: 4.3 mEq/L (ref 3.5–5.1)
Sodium: 139 (ref 137–147)

## 2023-02-13 LAB — COMPREHENSIVE METABOLIC PANEL
Calcium: 8.5 — AB (ref 8.7–10.7)
eGFR: 66

## 2023-02-13 LAB — CBC: RBC: 3.25 — AB (ref 3.87–5.11)

## 2023-02-14 ENCOUNTER — Encounter: Payer: Self-pay | Admitting: Student

## 2023-02-14 NOTE — Progress Notes (Unsigned)
Provider:  Dr. Earnestine Mealing Location:  Other [facility].  Nursing Home Room Number: [facility] 119A Place of Service:  SNF (31)  PCP: Ardyth Gal, MD Patient Care Team: Ardyth Gal, MD as PCP - General (Internal Medicine)  Extended Emergency Contact Information Primary Emergency Contact: Lynann Beaver [place] Home Phone: [phone] Relation: Spouse Secondary Emergency Contact: [place],SHANNON Mobile Phone: [phone] Relation: Daughter  Code Status: Full Code Goals of Care: Advanced Directive information    02/10/2023   10:42 AM  Advanced Directives  Does Patient Have a Medical Advance Directive? No      Chief Complaint  Patient presents with  . New Admit To SNF    Admission.     HPI: Patient is a 82 y.o. female seen today for admission to [facility] after hospitalization for right hip fracture on 8/3. PMH of DM, HTN, MCI, Hx of CVA with right sided deficits. ORIF on 8/3.   She was going to the bathroom in the middle of the night and slid off of the bed on the floor.   Before she was making good progress from the stroke. It was Octoer 7, 2023. This was her second fall since January. No issues then but this caused a fracture.  Prior to hospitalization she was going to the bathroom alone, but needed some help with showering, etc. Medications he helped manage. He was cooking cleaning and putting out clothes for her while she would put them on herself. No longer driving  [place] were in [place], [place] and moved here in January. They have a house near their daughter. It's not ADA house, but has some things for people with mobility issues. All one level.  Patient was on an increasing titration of metformin and had diarrhea when taking 1g BID. Misstepped when using her walker and fell on her right hip. Patient was started on glipizide on disharge and she is tolerating .   Past Medical History:  Diagnosis Date  . History of ischemic stroke     Past Surgical History:  Procedure Laterality Date  . INTRAMEDULLARY (IM) NAIL INTERTROCHANTERIC Right 02/04/2023   Procedure: INTRAMEDULLARY (IM) NAIL INTERTROCHANTERIC;  Surgeon: Campbell Stall, MD;  Location: [facility] ORS;  Service: Orthopedics;  Laterality: Right;    has no history on file for tobacco use, alcohol use, and drug use. Social History   Socioeconomic History  . Marital status: Married    Spouse name: Not on file  . Number of children: Not on file  . Years of education: Not on file  . Highest education level: Not on file  Occupational History  . Not on file  Tobacco Use  . Smoking status: Not on file  . Smokeless tobacco: Not on file  Substance and Sexual Activity  . Alcohol use: Not on file  . Drug use: Not on file  . Sexual activity: Not on file  Other Topics Concern  . Not on file  Social History Narrative  . Not on file   Social Determinants of Health   Financial Resource Strain: Not on file  Food Insecurity: No Food Insecurity (02/04/2023)   Hunger Vital Sign   . Worried About Programme researcher, broadcasting/film/video in the Last Year: Never true   . Ran Out of Food in the Last Year: Never true  Transportation Needs: No Transportation Needs (02/04/2023)   PRAPARE - Transportation   . Lack of Transportation (Medical): No   . Lack of Transportation (Non-Medical): No  Physical  Activity: Not on file  Stress: Not on file  Social Connections: Not on file  Intimate Partner Violence: Not At Risk (02/04/2023)   Humiliation, Afraid, Rape, and Kick questionnaire   . Fear of Current or Ex-Partner: No   . Emotionally Abused: No   . Physically Abused: No   . Sexually Abused: No    Functional Status Survey:    History reviewed. No pertinent family history.  Health Maintenance  Topic Date Due  . COVID-19 Vaccine (1) Never done  . FOOT EXAM  Never done  . OPHTHALMOLOGY EXAM  Never done  . Diabetic kidney evaluation - Urine ACR  Never done  . DTaP/Tdap/Td (1 - Tdap) Never done   . Zoster Vaccines- Shingrix (1 of 2) Never done  . DEXA SCAN  Never done  . INFLUENZA VACCINE  02/02/2023  . HEMOGLOBIN A1C  08/07/2023  . Medicare Annual Wellness (AWV)  02/02/2024  . Diabetic kidney evaluation - eGFR measurement  02/08/2024  . Pneumonia Vaccine 35+ Years old  Completed  . HPV VACCINES  Aged Out    Allergies  Allergen Reactions  . Sulfa Antibiotics     Outpatient Encounter Medications as of 02/15/2023  Medication Sig  . acetaminophen (TYLENOL) 325 MG tablet Take 2 tablets (650 mg total) by mouth every 6 (six) hours as needed for mild pain.  Marland Kitchen amLODipine (NORVASC) 5 MG tablet Take 5 mg by mouth 2 (two) times daily.  Marland Kitchen atorvastatin (LIPITOR) 80 MG tablet Take 80 mg by mouth daily.  . Calcium Carb-Cholecalciferol 500-5 MG-MCG TABS Take 1 tablet by mouth 2 (two) times daily with a meal.  . clopidogrel (PLAVIX) 75 MG tablet Take 1 tablet by mouth daily.  Marland Kitchen donepezil (ARICEPT) 10 MG tablet Take 10 mg by mouth at bedtime.  . enoxaparin (LOVENOX) 40 MG/0.4ML injection Inject 0.4 mLs (40 mg total) into the skin daily for 14 days.  Marland Kitchen glipiZIDE (GLUCOTROL) 5 MG tablet Take 1 tablet (5 mg total) by mouth daily before breakfast.  . mirtazapine (REMERON SOL-TAB) 15 MG disintegrating tablet Take 15 mg by mouth at bedtime.  . pantoprazole (PROTONIX) 20 MG tablet Take 1 tablet by mouth daily.  . polyethylene glycol (MIRALAX / GLYCOLAX) 17 g packet Take 17 g by mouth daily.  Marland Kitchen senna (SENOKOT) 8.6 MG TABS tablet Take 2 tablets (17.2 mg total) by mouth at bedtime.  . tamsulosin (FLOMAX) 0.4 MG CAPS capsule Take 1 capsule by mouth daily.  Marland Kitchen telmisartan (MICARDIS) 80 MG tablet Take 1 tablet by mouth daily.  . [DISCONTINUED] HYDROcodone-acetaminophen (NORCO/VICODIN) 5-325 MG tablet Take 1 tablet by mouth every 6 (six) hours as needed for up to 5 days for moderate pain.   No facility-administered encounter medications on file as of 02/15/2023.    Review of Systems  Vitals:   02/15/23  0857  BP: 130/70  Pulse: 80  Resp: 18  Temp: 97.6 F (36.4 C)  SpO2: 96%  Weight: 134 lb 8 oz (61 kg)  Height: 5\' 6"  (1.676 m)   Body mass index is 21.71 kg/m. Physical Exam  Labs reviewed: Basic Metabolic Panel: Recent Labs    02/06/23 0446 02/07/23 0421 02/08/23 0904  NA 134* 135 138  K 3.8 3.8 3.6  CL 104 105 106  CO2 21* 24 24  GLUCOSE 192* 159* 188*  BUN 23 22 18   CREATININE 1.01* 1.00 0.88  CALCIUM 8.3* 8.3* 8.8*   Liver Function Tests: No results for input(s): "AST", "ALT", "ALKPHOS", "BILITOT", "PROT", "ALBUMIN"  in the last 8760 hours. No results for input(s): "LIPASE", "AMYLASE" in the last 8760 hours. No results for input(s): "AMMONIA" in the last 8760 hours. CBC: Recent Labs    02/04/23 0316 02/05/23 0443 02/05/23 1351 02/06/23 0446 02/07/23 0421 02/08/23 0904  WBC 12.1* 10.4  --  11.2*  --  9.9  NEUTROABS 6.1  --   --   --   --   --   HGB 10.1* 7.0*   < > 7.5* 7.3* 10.2*  HCT 31.7* 21.3*   < > 23.2* 22.3* 31.1*  MCV 85.0 84.2  --  81.7  --  84.3  PLT 233 171  --  132*  --  200   < > = values in this interval not displayed.   Cardiac Enzymes: No results for input(s): "CKTOTAL", "CKMB", "CKMBINDEX", "TROPONINI" in the last 8760 hours. BNP: Invalid input(s): "POCBNP" Lab Results  Component Value Date   HGBA1C 7.3 (H) 02/04/2023   No results found for: "TSH" No results found for: "VITAMINB12" No results found for: "FOLATE" No results found for: "IRON", "TIBC", "FERRITIN"  Imaging and Procedures obtained prior to SNF admission: DG FEMUR PORT, MIN 2 VIEWS RIGHT  Result Date: 02/04/2023 CLINICAL DATA:  Postoperative state. Right femoral intramedullary nail. EXAM: RIGHT FEMUR PORTABLE 2 VIEW COMPARISON:  Pelvis and right hip radiographs 02/04/2023 FINDINGS: There is diffuse decreased bone mineralization. Interval long cephalomedullary nail fixation of the previously seen acute proximal right femoral intertrochanteric fracture. There is improved,  now near anatomic alignment. Mild right femoroacetabular joint space narrowing. Moderate pubic symphysis and mild sacroiliac osteoarthritis. Prior total right knee arthroplasty. No evidence of hardware complication. Moderate to high-grade atherosclerotic calcifications. Expected postoperative subcutaneous air lateral to the right pelvis and proximal femur. Nondisplaced superior and undisplaced inferior right pubic ramus fractures are again noted. IMPRESSION: Interval long cephalomedullary nail fixation of the previously seen acute proximal right femoral intertrochanteric fracture. Improved, now near anatomic alignment. Electronically Signed   By: Neita Garnet M.D.   On: 02/04/2023 17:27   DG HIP UNILAT WITH PELVIS 2-3 VIEWS RIGHT  Result Date: 02/04/2023 CLINICAL DATA:  Elective surgery. EXAM: DG HIP (WITH OR WITHOUT PELVIS) 2-3V RIGHT COMPARISON:  Preoperative radiograph. FINDINGS: Four fluoroscopic spot views of the right hip and femur obtained in the operating room. Femoral intramedullary nail with trans trochanteric and distal locking screw fixation of intertrochanteric femur fracture. Right pubic rami fractures are faintly visualized. Fluoroscopy time 1 minutes 30 seconds. Dose 9.42 mGy. IMPRESSION: Intraoperative fluoroscopy during ORIF of right intertrochanteric femur fracture. Electronically Signed   By: Narda Rutherford M.D.   On: 02/04/2023 16:34   DG C-Arm 1-60 Min-No Report  Result Date: 02/04/2023 Fluoroscopy was utilized by the requesting physician.  No radiographic interpretation.   DG Chest Portable 1 View  Result Date: 02/04/2023 CLINICAL DATA:  Initial evaluation for acute trauma, fall, hip fracture. EXAM: PORTABLE CHEST 1 VIEW COMPARISON:  None Available. FINDINGS: Cardiomegaly. Mediastinal silhouette within normal limits. Aortic atherosclerosis. Lungs normally inflated. Mild chronic coarsening of the interstitial markings. Linear atelectasis versus scarring noted at the left lung base.  Small calcified granuloma noted at the right lung base. No focal infiltrates. No edema or effusion. No pneumothorax. No acute osseous finding.  Osteopenia noted. IMPRESSION: 1. No radiographic evidence for active cardiopulmonary disease. 2. Cardiomegaly without edema. Aortic Atherosclerosis (ICD10-I70.0). Electronically Signed   By: Rise Mu M.D.   On: 02/04/2023 03:54   DG Hip Unilat W or Wo Pelvis 2-3 Views  Right  Result Date: 02/04/2023 CLINICAL DATA:  Initial evaluation for acute trauma, fall, hip pain. EXAM: DG HIP (WITH OR WITHOUT PELVIS) 2-3V RIGHT COMPARISON:  None Available. FINDINGS: Acute fracture involving the inter trochanteric right hip with superior subluxation. Femoral head remains in normal alignment with the acetabulum. There are associated acute minimally displaced fractures of the right superior and inferior pubic rami. Remainder of the bony pelvis otherwise intact. No abnormality about the contralateral left hip. Underlying osteopenia. Lower lumbar spondylosis noted. Scattered vascular calcifications noted. No other visible soft tissue abnormality. IMPRESSION: 1. Acute intertrochanteric fracture of the right hip with superior subluxation. 2. Acute minimally displaced fractures of the right superior and inferior pubic rami. Electronically Signed   By: Rise Mu M.D.   On: 02/04/2023 03:52    Assessment/Plan There are no diagnoses linked to this encounter.   Family/ staff Communication:   Labs/tests ordered:

## 2023-02-15 ENCOUNTER — Encounter: Payer: Self-pay | Admitting: Student

## 2023-02-15 ENCOUNTER — Non-Acute Institutional Stay (SKILLED_NURSING_FACILITY): Payer: Medicare Other | Admitting: Student

## 2023-02-15 DIAGNOSIS — S72141S Displaced intertrochanteric fracture of right femur, sequela: Secondary | ICD-10-CM

## 2023-02-15 DIAGNOSIS — I69351 Hemiplegia and hemiparesis following cerebral infarction affecting right dominant side: Secondary | ICD-10-CM | POA: Diagnosis not present

## 2023-02-15 DIAGNOSIS — F0390 Unspecified dementia without behavioral disturbance: Secondary | ICD-10-CM

## 2023-02-15 DIAGNOSIS — E1169 Type 2 diabetes mellitus with other specified complication: Secondary | ICD-10-CM

## 2023-02-15 DIAGNOSIS — S32591S Other specified fracture of right pubis, sequela: Secondary | ICD-10-CM | POA: Diagnosis not present

## 2023-02-15 DIAGNOSIS — D62 Acute posthemorrhagic anemia: Secondary | ICD-10-CM

## 2023-02-15 DIAGNOSIS — I10 Essential (primary) hypertension: Secondary | ICD-10-CM

## 2023-02-16 LAB — CBC AND DIFFERENTIAL
HCT: 31 — AB (ref 36–46)
Hemoglobin: 9.8 — AB (ref 12.0–16.0)
Neutrophils Absolute: 5798
Platelets: 469 10*3/uL — AB (ref 150–400)
WBC: 9.6

## 2023-02-16 LAB — CBC: RBC: 7 — AB (ref 3.87–5.11)

## 2023-03-02 ENCOUNTER — Encounter: Payer: Self-pay | Admitting: Nurse Practitioner

## 2023-03-02 ENCOUNTER — Non-Acute Institutional Stay (SKILLED_NURSING_FACILITY): Payer: Medicare Other | Admitting: Nurse Practitioner

## 2023-03-02 DIAGNOSIS — I69351 Hemiplegia and hemiparesis following cerebral infarction affecting right dominant side: Secondary | ICD-10-CM | POA: Diagnosis not present

## 2023-03-02 DIAGNOSIS — F0390 Unspecified dementia without behavioral disturbance: Secondary | ICD-10-CM

## 2023-03-02 DIAGNOSIS — M8000XD Age-related osteoporosis with current pathological fracture, unspecified site, subsequent encounter for fracture with routine healing: Secondary | ICD-10-CM

## 2023-03-02 DIAGNOSIS — S72141S Displaced intertrochanteric fracture of right femur, sequela: Secondary | ICD-10-CM | POA: Diagnosis not present

## 2023-03-02 DIAGNOSIS — D62 Acute posthemorrhagic anemia: Secondary | ICD-10-CM | POA: Diagnosis not present

## 2023-03-02 DIAGNOSIS — E785 Hyperlipidemia, unspecified: Secondary | ICD-10-CM

## 2023-03-02 DIAGNOSIS — K219 Gastro-esophageal reflux disease without esophagitis: Secondary | ICD-10-CM

## 2023-03-02 DIAGNOSIS — E1169 Type 2 diabetes mellitus with other specified complication: Secondary | ICD-10-CM | POA: Diagnosis not present

## 2023-03-02 DIAGNOSIS — I10 Essential (primary) hypertension: Secondary | ICD-10-CM

## 2023-03-02 MED ORDER — DONEPEZIL HCL 10 MG PO TABS
10.0000 mg | ORAL_TABLET | Freq: Every day | ORAL | 0 refills | Status: AC
Start: 2023-03-02 — End: ?

## 2023-03-02 MED ORDER — PANTOPRAZOLE SODIUM 20 MG PO TBEC
20.0000 mg | DELAYED_RELEASE_TABLET | Freq: Every day | ORAL | 0 refills | Status: AC
Start: 2023-03-02 — End: ?

## 2023-03-02 MED ORDER — GLIPIZIDE ER 5 MG PO TB24
5.0000 mg | ORAL_TABLET | Freq: Every day | ORAL | 0 refills | Status: AC
Start: 2023-03-02 — End: ?

## 2023-03-02 MED ORDER — ATORVASTATIN CALCIUM 80 MG PO TABS
80.0000 mg | ORAL_TABLET | Freq: Every day | ORAL | 0 refills | Status: AC
Start: 2023-03-02 — End: ?

## 2023-03-02 MED ORDER — TELMISARTAN 80 MG PO TABS
80.0000 mg | ORAL_TABLET | Freq: Every day | ORAL | 0 refills | Status: AC
Start: 2023-03-02 — End: ?

## 2023-03-02 MED ORDER — MIRTAZAPINE 15 MG PO TBDP: 15.0000 mg | ORAL_TABLET | Freq: Every day | ORAL | 0 refills | Status: AC

## 2023-03-02 MED ORDER — AMLODIPINE BESYLATE 5 MG PO TABS
5.0000 mg | ORAL_TABLET | Freq: Two times a day (BID) | ORAL | 0 refills | Status: AC
Start: 2023-03-02 — End: ?

## 2023-03-02 MED ORDER — CLOPIDOGREL BISULFATE 75 MG PO TABS
75.0000 mg | ORAL_TABLET | Freq: Every day | ORAL | 0 refills | Status: AC
Start: 2023-03-02 — End: ?

## 2023-03-02 MED ORDER — ALENDRONATE SODIUM 70 MG PO TABS
70.0000 mg | ORAL_TABLET | ORAL | 0 refills | Status: AC
Start: 2023-03-02 — End: ?

## 2023-03-02 NOTE — Progress Notes (Signed)
Location:  Other [place].  Nursing Home Room Number: [facility] [address] Place of Service:  SNF [id number]) Abbey Chatters, NP  PCP: Ardyth Gal, MD  Patient Care Team: Ardyth Gal, MD as PCP - General (Internal Medicine)  Extended Emergency Contact Information Primary Emergency Contact: Lynann Beaver [place] Home Phone: [phone] Relation: Spouse Secondary Emergency Contact: [place],SHANNON Mobile Phone: [phone] Relation: Daughter  Goals of care: Advanced Directive information    03/02/2023    8:48 AM  Advanced Directives  Does Patient Have a Medical Advance Directive? No  Would patient like information on creating a medical advance directive? No - Patient declined     Chief Complaint  Patient presents with   Discharge Note    Discharge.     HPI:  Pt is a 82 y.o. female seen today for Discharge.  Pt with hx of dementia, CVA had a fall at home which resulted in right hip fracture.  She is now s/p ORIF on 8/3 and doing well and ready to dc home with 24/7 care.  Pt will be living with husband and daughter and have home health therapy.    Past Medical History:  Diagnosis Date   History of ischemic stroke    Past Surgical History:  Procedure Laterality Date   INTRAMEDULLARY (IM) NAIL INTERTROCHANTERIC Right 02/04/2023   Procedure: INTRAMEDULLARY (IM) NAIL INTERTROCHANTERIC;  Surgeon: Campbell Stall, MD;  Location: [facility] ORS;  Service: Orthopedics;  Laterality: Right;    Allergies  Allergen Reactions   Sulfa Antibiotics     Outpatient Encounter Medications as of 03/02/2023  Medication Sig   acetaminophen (TYLENOL) 325 MG tablet Take 2 tablets (650 mg total) by mouth every 6 (six) hours as needed for mild pain.   acetaminophen (TYLENOL) 500 MG tablet Take 1,000 mg by mouth 2 (two) times daily.   alendronate (FOSAMAX) 70 MG tablet Take 70 mg by mouth once a week. Take with a full glass of water on an empty stomach.   amLODipine  (NORVASC) 5 MG tablet Take 5 mg by mouth 2 (two) times daily.   atorvastatin (LIPITOR) 80 MG tablet Take 80 mg by mouth daily.   Calcium Carb-Cholecalciferol 500-5 MG-MCG TABS Take 1 tablet by mouth 2 (two) times daily with a meal.   clopidogrel (PLAVIX) 75 MG tablet Take 1 tablet by mouth daily.   donepezil (ARICEPT) 10 MG tablet Take 10 mg by mouth at bedtime.   glipiZIDE (GLUCOTROL XL) 5 MG 24 hr tablet Take 5 mg by mouth daily with breakfast. Along with 1/2 (2.5mg ) to equal 7.5mg /day.   mirtazapine (REMERON SOL-TAB) 15 MG disintegrating tablet Take 15 mg by mouth at bedtime.   pantoprazole (PROTONIX) 20 MG tablet Take 1 tablet by mouth daily.   polyethylene glycol (MIRALAX / GLYCOLAX) 17 g packet Take 17 g by mouth daily.   senna (SENOKOT) 8.6 MG TABS tablet Take 2 tablets (17.2 mg total) by mouth at bedtime.   telmisartan (MICARDIS) 80 MG tablet Take 1 tablet by mouth daily.   traZODone (DESYREL) 50 MG tablet Take 25 mg by mouth daily as needed for sleep.   [DISCONTINUED] glipiZIDE (GLUCOTROL) 5 MG tablet Take 1 tablet (5 mg total) by mouth daily before breakfast. (Patient taking differently: Take 5 mg by mouth daily before breakfast. Along with a 1/2 tablet to equal 7.5mg /day.)   [DISCONTINUED] enoxaparin (LOVENOX) 40 MG/0.4ML injection Inject 0.4 mLs (40 mg total) into the skin daily for 14 days.   [DISCONTINUED] tamsulosin (  FLOMAX) 0.4 MG CAPS capsule Take 1 capsule by mouth daily.   No facility-administered encounter medications on file as of 03/02/2023.    Review of Systems  Unable to perform ROS: Dementia    Immunization History  Administered Date(s) Administered   Influenza-Unspecified 03/05/2022   PNEUMOCOCCAL CONJUGATE-20 10/04/2022, 02/11/2023   RSV,unspecified 02/10/2023   Pertinent  Health Maintenance Due  Topic Date Due   FOOT EXAM  Never done   OPHTHALMOLOGY EXAM  Never done   DEXA SCAN  Never done   INFLUENZA VACCINE  02/02/2023   HEMOGLOBIN A1C  08/07/2023        No data to display         Functional Status Survey:    Vitals:   03/02/23 0835  BP: 119/67  Pulse: 95  Resp: 18  Temp: 98.2 F (36.8 C)  SpO2: 93%  Weight: 136 lb 12.8 oz (62.1 kg)  Height: 5\' 6"  (1.676 m)   Body mass index is 22.08 kg/m. Physical Exam Constitutional:      General: She is not in acute distress.    Appearance: She is well-developed. She is not diaphoretic.  HENT:     Head: Normocephalic and atraumatic.     Mouth/Throat:     Pharynx: No oropharyngeal exudate.  Eyes:     Conjunctiva/sclera: Conjunctivae normal.     Pupils: Pupils are equal, round, and reactive to light.  Cardiovascular:     Rate and Rhythm: Normal rate and regular rhythm.     Heart sounds: Normal heart sounds.  Pulmonary:     Effort: Pulmonary effort is normal.     Breath sounds: Normal breath sounds.  Abdominal:     General: Bowel sounds are normal.     Palpations: Abdomen is soft.  Musculoskeletal:     Cervical back: Normal range of motion and neck supple.     Right lower leg: No edema.     Left lower leg: No edema.  Skin:    General: Skin is warm and dry.  Neurological:     Mental Status: She is alert. Mental status is at baseline.     Motor: Weakness present.     Gait: Gait abnormal.  Psychiatric:        Mood and Affect: Mood normal.     Labs reviewed: Recent Labs    02/06/23 0446 02/07/23 0421 02/08/23 0904 02/13/23 0000  NA 134* 135 138 139  K 3.8 3.8 3.6 4.3  CL 104 105 106 104  CO2 21* 24 24 27*  GLUCOSE 192* 159* 188*  --   BUN 23 22 18  22*  CREATININE 1.01* 1.00 0.88 0.9  CALCIUM 8.3* 8.3* 8.8* 8.5*   No results for input(s): "AST", "ALT", "ALKPHOS", "BILITOT", "PROT", "ALBUMIN" in the last 8760 hours. Recent Labs    02/04/23 0316 02/05/23 0443 02/05/23 1351 02/06/23 0446 02/07/23 0421 02/08/23 0904 02/13/23 0000 02/16/23 0000  WBC 12.1* 10.4  --  11.2*  --  9.9 9.3 9.6  NEUTROABS 6.1  --   --   --   --   --  5,273.00 5,798.00  HGB 10.1*  7.0*   < > 7.5*   < > 10.2* 8.8* 9.8*  HCT 31.7* 21.3*   < > 23.2*   < > 31.1* 28* 31*  MCV 85.0 84.2  --  81.7  --  84.3  --   --   PLT 233 171  --  132*  --  200 330 469*   < > =  values in this interval not displayed.   No results found for: "TSH" Lab Results  Component Value Date   HGBA1C 7.3 (H) 02/04/2023   No results found for: "CHOL", "HDL", "LDLCALC", "LDLDIRECT", "TRIG", "CHOLHDL"  Significant Diagnostic Results in last 30 days:  DG FEMUR PORT, MIN 2 VIEWS RIGHT  Result Date: 02/04/2023 CLINICAL DATA:  Postoperative state. Right femoral intramedullary nail. EXAM: RIGHT FEMUR PORTABLE 2 VIEW COMPARISON:  Pelvis and right hip radiographs 02/04/2023 FINDINGS: There is diffuse decreased bone mineralization. Interval long cephalomedullary nail fixation of the previously seen acute proximal right femoral intertrochanteric fracture. There is improved, now near anatomic alignment. Mild right femoroacetabular joint space narrowing. Moderate pubic symphysis and mild sacroiliac osteoarthritis. Prior total right knee arthroplasty. No evidence of hardware complication. Moderate to high-grade atherosclerotic calcifications. Expected postoperative subcutaneous air lateral to the right pelvis and proximal femur. Nondisplaced superior and undisplaced inferior right pubic ramus fractures are again noted. IMPRESSION: Interval long cephalomedullary nail fixation of the previously seen acute proximal right femoral intertrochanteric fracture. Improved, now near anatomic alignment. Electronically Signed   By: Neita Garnet M.D.   On: 02/04/2023 17:27   DG HIP UNILAT WITH PELVIS 2-3 VIEWS RIGHT  Result Date: 02/04/2023 CLINICAL DATA:  Elective surgery. EXAM: DG HIP (WITH OR WITHOUT PELVIS) 2-3V RIGHT COMPARISON:  Preoperative radiograph. FINDINGS: Four fluoroscopic spot views of the right hip and femur obtained in the operating room. Femoral intramedullary nail with trans trochanteric and distal locking screw  fixation of intertrochanteric femur fracture. Right pubic rami fractures are faintly visualized. Fluoroscopy time 1 minutes 30 seconds. Dose 9.42 mGy. IMPRESSION: Intraoperative fluoroscopy during ORIF of right intertrochanteric femur fracture. Electronically Signed   By: Narda Rutherford M.D.   On: 02/04/2023 16:34   DG C-Arm 1-60 Min-No Report  Result Date: 02/04/2023 Fluoroscopy was utilized by the requesting physician.  No radiographic interpretation.   DG Chest Portable 1 View  Result Date: 02/04/2023 CLINICAL DATA:  Initial evaluation for acute trauma, fall, hip fracture. EXAM: PORTABLE CHEST 1 VIEW COMPARISON:  None Available. FINDINGS: Cardiomegaly. Mediastinal silhouette within normal limits. Aortic atherosclerosis. Lungs normally inflated. Mild chronic coarsening of the interstitial markings. Linear atelectasis versus scarring noted at the left lung base. Small calcified granuloma noted at the right lung base. No focal infiltrates. No edema or effusion. No pneumothorax. No acute osseous finding.  Osteopenia noted. IMPRESSION: 1. No radiographic evidence for active cardiopulmonary disease. 2. Cardiomegaly without edema. Aortic Atherosclerosis (ICD10-I70.0). Electronically Signed   By: Rise Mu M.D.   On: 02/04/2023 03:54   DG Hip Unilat W or Wo Pelvis 2-3 Views Right  Result Date: 02/04/2023 CLINICAL DATA:  Initial evaluation for acute trauma, fall, hip pain. EXAM: DG HIP (WITH OR WITHOUT PELVIS) 2-3V RIGHT COMPARISON:  None Available. FINDINGS: Acute fracture involving the inter trochanteric right hip with superior subluxation. Femoral head remains in normal alignment with the acetabulum. There are associated acute minimally displaced fractures of the right superior and inferior pubic rami. Remainder of the bony pelvis otherwise intact. No abnormality about the contralateral left hip. Underlying osteopenia. Lower lumbar spondylosis noted. Scattered vascular calcifications noted. No  other visible soft tissue abnormality. IMPRESSION: 1. Acute intertrochanteric fracture of the right hip with superior subluxation. 2. Acute minimally displaced fractures of the right superior and inferior pubic rami. Electronically Signed   By: Rise Mu M.D.   On: 02/04/2023 03:52    Assessment/Plan 1. Intertrochanteric fracture of right hip, sequela -s/p ORIF, pain well controlled and  stable for dc home.   2. Anemia associated with acute blood loss Will need to have cbc followed by PCP  No signs of ongoing blood loss  3. Hemiplegia and hemiparesis following cerebral infarction affecting right dominant side (HCC) -stable, continues on plavix - clopidogrel (PLAVIX) 75 MG tablet; Take 1 tablet (75 mg total) by mouth daily.  Dispense: 30 tablet; Refill: 0  4. Type 2 diabetes mellitus with other specified complication, without long-term current use of insulin (HCC) -continues on home regimen, to monitor for hypoglycemia Pcp to follow up  - glipiZIDE (GLUCOTROL XL) 5 MG 24 hr tablet; Take 1 tablet (5 mg total) by mouth daily with breakfast. Along with 1/2 (2.5mg ) to equal 7.5mg /day.  Dispense: 45 tablet; Refill: 0  5. Essential (primary) hypertension -Blood pressure well controlled, goal bp <140/90 Continue current medications and dietary modifications follow metabolic panel - amLODipine (NORVASC) 5 MG tablet; Take 1 tablet (5 mg total) by mouth 2 (two) times daily.  Dispense: 60 tablet; Refill: 0 - telmisartan (MICARDIS) 80 MG tablet; Take 1 tablet (80 mg total) by mouth daily.  Dispense: 30 tablet; Refill: 0  6. Dementia, unspecified dementia severity, unspecified dementia type, unspecified whether behavioral, psychotic, or mood disturbance or anxiety (HCC) -Stable, no acute changes in cognitive or functional status, continue supportive care.  - donepezil (ARICEPT) 10 MG tablet; Take 1 tablet (10 mg total) by mouth at bedtime.  Dispense: 30 tablet; Refill: 0  7. Hyperlipidemia  LDL goal <70 - atorvastatin (LIPITOR) 80 MG tablet; Take 1 tablet (80 mg total) by mouth daily.  Dispense: 30 tablet; Refill: 0  8. Gastro-esophageal reflux disease without esophagitis Controlled  - pantoprazole (PROTONIX) 20 MG tablet; Take 1 tablet (20 mg total) by mouth daily.  Dispense: 30 tablet; Refill: 0  9. Osteoporosis with current pathological fracture with routine healing, unspecified osteoporosis type, subsequent encounter -continue cal and vit d - alendronate (FOSAMAX) 70 MG tablet; Take 1 tablet (70 mg total) by mouth once a week. Take with a full glass of water on an empty stomach.  Dispense: 4 tablet; Refill: 0  10. Insomnia She is currently on remeron. Will stop trazodone at this time since she is going home    Dc home with home health PT/OT Medications sent to pharmacy To follow up with PCP in within the next 2 weeks.   Janene Harvey. Biagio Borg [facility] [phone]

## 2023-03-09 ENCOUNTER — Ambulatory Visit: Admit: 2023-03-09 | Discharge: 2023-03-10 | Payer: MEDICARE

## 2023-03-09 ENCOUNTER — Ambulatory Visit
Admit: 2023-03-09 | Discharge: 2023-03-10 | Payer: MEDICARE | Attending: Hematology & Oncology | Primary: Hematology & Oncology

## 2023-03-09 DIAGNOSIS — S72141S Displaced intertrochanteric fracture of right femur, sequela: Principal | ICD-10-CM

## 2023-03-09 DIAGNOSIS — E119 Type 2 diabetes mellitus without complications: Principal | ICD-10-CM

## 2023-03-09 DIAGNOSIS — K219 Gastro-esophageal reflux disease without esophagitis: Principal | ICD-10-CM

## 2023-03-09 DIAGNOSIS — E7800 Pure hypercholesterolemia, unspecified: Principal | ICD-10-CM

## 2023-03-09 DIAGNOSIS — R2241 Localized swelling, mass and lump, right lower limb: Principal | ICD-10-CM

## 2023-03-09 DIAGNOSIS — I10 Essential (primary) hypertension: Principal | ICD-10-CM

## 2023-03-09 DIAGNOSIS — Z09 Encounter for follow-up examination after completed treatment for conditions other than malignant neoplasm: Principal | ICD-10-CM

## 2023-03-09 DIAGNOSIS — G301 Alzheimer's disease with late onset: Principal | ICD-10-CM

## 2023-03-09 DIAGNOSIS — F02A Mild late onset Alzheimer's dementia without behavioral disturbance, psychotic disturbance, mood disturbance, or anxiety (CMS-HCC): Principal | ICD-10-CM

## 2023-03-09 DIAGNOSIS — D62 Acute posthemorrhagic anemia: Principal | ICD-10-CM

## 2023-03-10 ENCOUNTER — Other Ambulatory Visit: Admit: 2023-03-10 | Discharge: 2023-03-11 | Payer: MEDICARE

## 2023-04-04 ENCOUNTER — Institutional Professional Consult (permissible substitution): Admit: 2023-04-04 | Discharge: 2023-04-05 | Payer: MEDICARE

## 2023-04-04 DIAGNOSIS — H538 Other visual disturbances: Principal | ICD-10-CM

## 2023-04-05 ENCOUNTER — Ambulatory Visit
Admit: 2023-04-05 | Discharge: 2023-04-06 | Payer: MEDICARE | Attending: Hematology & Oncology | Primary: Hematology & Oncology

## 2023-04-05 DIAGNOSIS — L309 Dermatitis, unspecified: Principal | ICD-10-CM

## 2023-04-05 MED ORDER — TRIAMCINOLONE ACETONIDE 0.1 % TOPICAL CREAM
Freq: Two times a day (BID) | TOPICAL | 0 refills | 0 days | Status: CP
Start: 2023-04-05 — End: 2024-04-04

## 2023-04-10 ENCOUNTER — Other Ambulatory Visit: Payer: Self-pay | Admitting: Nurse Practitioner

## 2023-04-10 ENCOUNTER — Ambulatory Visit: Admit: 2023-04-10 | Discharge: 2023-04-11 | Payer: MEDICARE

## 2023-04-10 DIAGNOSIS — E119 Type 2 diabetes mellitus without complications: Principal | ICD-10-CM

## 2023-04-10 DIAGNOSIS — I10 Essential (primary) hypertension: Secondary | ICD-10-CM

## 2023-04-10 MED ORDER — GLIPIZIDE 5 MG TABLET
ORAL_TABLET | Freq: Every day | ORAL | 0 refills | 120 days | Status: CP
Start: 2023-04-10 — End: ?

## 2023-04-15 ENCOUNTER — Other Ambulatory Visit: Payer: Self-pay | Admitting: Nurse Practitioner

## 2023-04-15 DIAGNOSIS — M8000XD Age-related osteoporosis with current pathological fracture, unspecified site, subsequent encounter for fracture with routine healing: Secondary | ICD-10-CM

## 2023-04-17 ENCOUNTER — Ambulatory Visit: Admit: 2023-04-17 | Discharge: 2023-04-17 | Payer: MEDICARE

## 2023-04-17 DIAGNOSIS — F0390 Unspecified dementia without behavioral disturbance: Principal | ICD-10-CM

## 2023-04-17 DIAGNOSIS — I639 Cerebral infarction, unspecified: Principal | ICD-10-CM

## 2023-04-20 ENCOUNTER — Other Ambulatory Visit: Payer: Self-pay | Admitting: Nurse Practitioner

## 2023-04-20 DIAGNOSIS — E1169 Type 2 diabetes mellitus with other specified complication: Secondary | ICD-10-CM

## 2023-04-24 DIAGNOSIS — E119 Type 2 diabetes mellitus without complications: Principal | ICD-10-CM

## 2023-04-24 DIAGNOSIS — M8000XD Age-related osteoporosis with current pathological fracture, unspecified site, subsequent encounter for fracture with routine healing: Principal | ICD-10-CM

## 2023-04-24 MED ORDER — GLIPIZIDE 2.5 MG TABLET
ORAL_TABLET | 0 refills | 0 days | Status: CP
Start: 2023-04-24 — End: ?

## 2023-04-24 MED ORDER — ALENDRONATE 70 MG TABLET
ORAL_TABLET | ORAL | 0 refills | 84 days | Status: CP
Start: 2023-04-24 — End: ?

## 2023-04-25 MED ORDER — GLIPIZIDE 2.5 MG TABLET
ORAL_TABLET | 0 refills | 0 days | Status: CP
Start: 2023-04-25 — End: ?

## 2023-05-01 DIAGNOSIS — Z8673 Personal history of transient ischemic attack (TIA), and cerebral infarction without residual deficits: Principal | ICD-10-CM

## 2023-05-01 MED ORDER — ATORVASTATIN 80 MG TABLET
ORAL_TABLET | ORAL | 1 refills | 90 days | Status: CP
Start: 2023-05-01 — End: 2024-04-30

## 2023-05-01 MED ORDER — CLOPIDOGREL 75 MG TABLET
ORAL_TABLET | Freq: Every day | ORAL | 1 refills | 90 days | Status: CP
Start: 2023-05-01 — End: 2024-04-30

## 2023-05-04 DIAGNOSIS — F0390 Unspecified dementia without behavioral disturbance: Principal | ICD-10-CM

## 2023-05-10 ENCOUNTER — Other Ambulatory Visit: Payer: Self-pay | Admitting: Nurse Practitioner

## 2023-05-10 DIAGNOSIS — F0390 Unspecified dementia without behavioral disturbance: Secondary | ICD-10-CM

## 2023-05-26 ENCOUNTER — Institutional Professional Consult (permissible substitution): Admit: 2023-05-26 | Discharge: 2023-05-27 | Payer: MEDICARE

## 2023-05-26 MED ORDER — DONEPEZIL 10 MG TABLET
ORAL_TABLET | Freq: Every evening | ORAL | 3 refills | 90 days | Status: CP
Start: 2023-05-26 — End: 2024-05-25

## 2023-06-01 DIAGNOSIS — I10 Essential (primary) hypertension: Principal | ICD-10-CM

## 2023-06-05 MED ORDER — TELMISARTAN 80 MG TABLET
ORAL_TABLET | Freq: Every day | ORAL | 0 refills | 90 days | Status: CP
Start: 2023-06-05 — End: ?

## 2023-06-07 ENCOUNTER — Ambulatory Visit
Admit: 2023-06-07 | Discharge: 2023-06-08 | Payer: MEDICARE | Attending: Hematology & Oncology | Primary: Hematology & Oncology

## 2023-06-07 DIAGNOSIS — I10 Essential (primary) hypertension: Principal | ICD-10-CM

## 2023-06-07 DIAGNOSIS — E119 Type 2 diabetes mellitus without complications: Principal | ICD-10-CM

## 2023-06-07 DIAGNOSIS — E7800 Pure hypercholesterolemia, unspecified: Principal | ICD-10-CM

## 2023-06-07 DIAGNOSIS — Z1329 Encounter for screening for other suspected endocrine disorder: Principal | ICD-10-CM

## 2023-06-07 MED ORDER — GLIPIZIDE 2.5 MG TABLET
ORAL_TABLET | 1 refills | 0 days | Status: CP
Start: 2023-06-07 — End: ?

## 2023-06-07 MED ORDER — AMLODIPINE 5 MG TABLET
ORAL_TABLET | Freq: Two times a day (BID) | ORAL | 1 refills | 45 days | Status: CP
Start: 2023-06-07 — End: 2023-09-05

## 2023-06-07 MED ORDER — GLIPIZIDE 5 MG TABLET
ORAL_TABLET | Freq: Every day | ORAL | 1 refills | 90 days | Status: CP
Start: 2023-06-07 — End: ?

## 2023-06-21 ENCOUNTER — Ambulatory Visit: Admit: 2023-06-21 | Discharge: 2023-06-22 | Payer: MEDICARE | Attending: Adult Health | Primary: Adult Health

## 2023-06-21 DIAGNOSIS — Z0189 Encounter for other specified special examinations: Principal | ICD-10-CM

## 2023-06-29 MED ORDER — PANTOPRAZOLE 20 MG TABLET,DELAYED RELEASE
ORAL_TABLET | Freq: Every day | ORAL | 0 refills | 90.00 days | Status: CP
Start: 2023-06-29 — End: ?

## 2023-07-09 DIAGNOSIS — E119 Type 2 diabetes mellitus without complications: Principal | ICD-10-CM

## 2023-07-10 MED ORDER — GLIPIZIDE 5 MG TABLET
ORAL_TABLET | ORAL | 0 refills | 0.00 days | Status: CP
Start: 2023-07-10 — End: ?

## 2023-07-11 MED ORDER — ACCU-CHEK GUIDE TEST STRIPS
ORAL_STRIP | 0 refills | 0.00 days | Status: CP
Start: 2023-07-11 — End: ?

## 2023-07-14 ENCOUNTER — Ambulatory Visit
Admit: 2023-07-14 | Discharge: 2023-07-15 | Payer: MEDICARE | Attending: Hematology & Oncology | Primary: Hematology & Oncology

## 2023-07-14 DIAGNOSIS — E119 Type 2 diabetes mellitus without complications: Principal | ICD-10-CM

## 2023-07-14 MED ORDER — METFORMIN 500 MG TABLET
ORAL_TABLET | Freq: Two times a day (BID) | ORAL | 1 refills | 90.00 days | Status: CP
Start: 2023-07-14 — End: 2024-07-13

## 2023-07-17 DIAGNOSIS — M8000XD Age-related osteoporosis with current pathological fracture, unspecified site, subsequent encounter for fracture with routine healing: Principal | ICD-10-CM

## 2023-07-17 MED ORDER — ALENDRONATE 70 MG TABLET
ORAL_TABLET | ORAL | 0 refills | 84.00 days | Status: CP
Start: 2023-07-17 — End: 2024-07-16

## 2023-08-10 ENCOUNTER — Inpatient Hospital Stay: Admit: 2023-08-10 | Discharge: 2023-08-10 | Payer: MEDICARE

## 2023-08-14 ENCOUNTER — Ambulatory Visit
Admit: 2023-08-14 | Discharge: 2023-08-15 | Payer: MEDICARE | Attending: Hematology & Oncology | Primary: Hematology & Oncology

## 2023-08-14 DIAGNOSIS — E1165 Type 2 diabetes mellitus with hyperglycemia: Principal | ICD-10-CM

## 2023-08-14 MED ORDER — METFORMIN 500 MG TABLET
ORAL_TABLET | 1 refills | 0.00 days | Status: CP
Start: 2023-08-14 — End: ?

## 2023-08-14 MED ORDER — GLIPIZIDE 5 MG TABLET
ORAL_TABLET | Freq: Every day | ORAL | 0 refills | 90.00 days | Status: CP
Start: 2023-08-14 — End: ?

## 2023-08-18 ENCOUNTER — Ambulatory Visit: Admit: 2023-08-18 | Discharge: 2023-08-19 | Payer: MEDICARE

## 2023-08-18 DIAGNOSIS — F0390 Unspecified dementia without behavioral disturbance: Principal | ICD-10-CM

## 2023-08-24 ENCOUNTER — Encounter: Admit: 2023-08-24 | Discharge: 2023-08-25 | Payer: MEDICARE

## 2023-08-24 DIAGNOSIS — F0280 Dementia in other diseases classified elsewhere without behavioral disturbance: Principal | ICD-10-CM

## 2023-08-24 DIAGNOSIS — R29818 Other symptoms and signs involving the nervous system: Principal | ICD-10-CM

## 2023-08-24 DIAGNOSIS — G309 Alzheimer's disease, unspecified: Principal | ICD-10-CM

## 2023-08-24 MED ORDER — MEMANTINE 10 MG TABLET
ORAL_TABLET | Freq: Two times a day (BID) | ORAL | 3 refills | 90.00 days | Status: CP
Start: 2023-08-24 — End: 2024-08-18

## 2023-09-02 DIAGNOSIS — I10 Essential (primary) hypertension: Principal | ICD-10-CM

## 2023-09-06 ENCOUNTER — Ambulatory Visit
Admit: 2023-09-06 | Discharge: 2023-09-07 | Payer: MEDICARE | Attending: Hematology & Oncology | Primary: Hematology & Oncology

## 2023-09-06 DIAGNOSIS — E1165 Type 2 diabetes mellitus with hyperglycemia: Principal | ICD-10-CM

## 2023-09-06 MED ORDER — METFORMIN 500 MG TABLET
ORAL_TABLET | Freq: Two times a day (BID) | ORAL | 1 refills | 90.00 days | Status: CP
Start: 2023-09-06 — End: ?

## 2023-09-06 MED ORDER — GLIPIZIDE 5 MG TABLET
ORAL_TABLET | Freq: Two times a day (BID) | ORAL | 1 refills | 90.00 days | Status: CP
Start: 2023-09-06 — End: ?

## 2023-09-07 DIAGNOSIS — I10 Essential (primary) hypertension: Principal | ICD-10-CM

## 2023-09-07 MED ORDER — TELMISARTAN 80 MG TABLET
ORAL_TABLET | Freq: Every day | ORAL | 3 refills | 90.00 days | Status: CP
Start: 2023-09-07 — End: 2024-09-06

## 2023-09-27 DIAGNOSIS — E1165 Type 2 diabetes mellitus with hyperglycemia: Principal | ICD-10-CM

## 2023-09-27 MED ORDER — METFORMIN 500 MG TABLET
ORAL_TABLET | Freq: Two times a day (BID) | ORAL | 1 refills | 90 days | Status: CP
Start: 2023-09-27 — End: ?

## 2023-09-27 MED ORDER — PANTOPRAZOLE 20 MG TABLET,DELAYED RELEASE
ORAL_TABLET | Freq: Every day | ORAL | 1 refills | 90 days | Status: CP
Start: 2023-09-27 — End: 2024-09-26

## 2023-10-02 ENCOUNTER — Ambulatory Visit: Admit: 2023-10-02 | Discharge: 2023-10-03 | Attending: Hematology & Oncology | Primary: Hematology & Oncology

## 2023-10-02 DIAGNOSIS — E1165 Type 2 diabetes mellitus with hyperglycemia: Principal | ICD-10-CM

## 2023-10-02 MED ORDER — METFORMIN 500 MG TABLET
ORAL_TABLET | Freq: Two times a day (BID) | ORAL | 1 refills | 90 days | Status: CP
Start: 2023-10-02 — End: ?

## 2023-10-06 DIAGNOSIS — M8000XD Age-related osteoporosis with current pathological fracture, unspecified site, subsequent encounter for fracture with routine healing: Principal | ICD-10-CM

## 2023-10-07 ENCOUNTER — Emergency Department: Admit: 2023-10-07 | Discharge: 2023-10-07 | Disposition: A | Discharge status: Home / Self Care

## 2023-10-07 DIAGNOSIS — E1165 Type 2 diabetes mellitus with hyperglycemia: Principal | ICD-10-CM

## 2023-10-07 DIAGNOSIS — L03115 Cellulitis of right lower limb: Principal | ICD-10-CM

## 2023-10-07 MED ORDER — CEPHALEXIN 500 MG TABLET
ORAL_TABLET | Freq: Four times a day (QID) | ORAL | 0 refills | 7.00 days | Status: CP
Start: 2023-10-07 — End: 2023-10-14

## 2023-10-09 MED ORDER — ALENDRONATE 70 MG TABLET
ORAL_TABLET | 3 refills | 0.00 days | Status: CP
Start: 2023-10-09 — End: ?

## 2023-10-10 DIAGNOSIS — E1165 Type 2 diabetes mellitus with hyperglycemia: Principal | ICD-10-CM

## 2023-10-10 MED ORDER — GLIPIZIDE 5 MG TABLET
ORAL_TABLET | Freq: Two times a day (BID) | ORAL | 1 refills | 90.00 days | Status: CP
Start: 2023-10-10 — End: ?

## 2023-10-11 ENCOUNTER — Emergency Department: Admit: 2023-10-11 | Discharge: 2023-10-11 | Disposition: A | Discharge status: Home / Self Care | Payer: MEDICARE

## 2023-10-11 ENCOUNTER — Emergency Department: Admit: 2023-10-11 | Discharge: 2023-10-11 | Disposition: A | Discharge status: Home / Self Care

## 2023-10-11 DIAGNOSIS — L03115 Cellulitis of right lower limb: Principal | ICD-10-CM

## 2023-10-12 ENCOUNTER — Ambulatory Visit: Admit: 2023-10-12 | Discharge: 2023-10-13 | Attending: Hematology & Oncology | Primary: Hematology & Oncology

## 2023-10-12 DIAGNOSIS — I10 Essential (primary) hypertension: Principal | ICD-10-CM

## 2023-10-12 DIAGNOSIS — L03115 Cellulitis of right lower limb: Principal | ICD-10-CM

## 2023-10-12 DIAGNOSIS — L309 Dermatitis, unspecified: Principal | ICD-10-CM

## 2023-10-12 DIAGNOSIS — R2241 Localized swelling, mass and lump, right lower limb: Principal | ICD-10-CM

## 2023-10-12 MED ORDER — AMLODIPINE 5 MG TABLET
ORAL_TABLET | Freq: Every day | ORAL | 1 refills | 90.00 days | Status: CP
Start: 2023-10-12 — End: 2024-01-10

## 2023-10-12 MED ORDER — TRIAMCINOLONE ACETONIDE 0.1 % TOPICAL CREAM
Freq: Two times a day (BID) | TOPICAL | 0 refills | 0.00 days | Status: CP
Start: 2023-10-12 — End: 2024-10-11

## 2023-10-18 ENCOUNTER — Ambulatory Visit
Admit: 2023-10-18 | Discharge: 2023-10-19 | Payer: MEDICARE | Attending: Hematology & Oncology | Primary: Hematology & Oncology

## 2023-10-18 DIAGNOSIS — L309 Dermatitis, unspecified: Principal | ICD-10-CM

## 2023-10-18 DIAGNOSIS — I10 Essential (primary) hypertension: Principal | ICD-10-CM

## 2023-10-18 DIAGNOSIS — L821 Other seborrheic keratosis: Principal | ICD-10-CM

## 2023-10-18 DIAGNOSIS — L03115 Cellulitis of right lower limb: Principal | ICD-10-CM

## 2023-10-18 MED ORDER — TRIAMCINOLONE ACETONIDE 0.1 % TOPICAL CREAM
Freq: Two times a day (BID) | TOPICAL | 0 refills | 0.00 days | Status: CP
Start: 2023-10-18 — End: 2024-10-17

## 2023-10-23 ENCOUNTER — Ambulatory Visit
Admit: 2023-10-23 | Discharge: 2023-10-24 | Payer: MEDICARE | Attending: Hematology & Oncology | Primary: Hematology & Oncology

## 2023-10-23 DIAGNOSIS — I10 Essential (primary) hypertension: Principal | ICD-10-CM

## 2023-10-23 MED ORDER — HYDROCHLOROTHIAZIDE 12.5 MG TABLET
ORAL_TABLET | Freq: Every day | ORAL | 1 refills | 90.00 days | Status: CP
Start: 2023-10-23 — End: 2024-11-26

## 2023-11-04 DIAGNOSIS — Z8673 Personal history of transient ischemic attack (TIA), and cerebral infarction without residual deficits: Principal | ICD-10-CM

## 2023-11-07 DIAGNOSIS — Z8673 Personal history of transient ischemic attack (TIA), and cerebral infarction without residual deficits: Principal | ICD-10-CM

## 2023-11-08 ENCOUNTER — Ambulatory Visit
Admit: 2023-11-08 | Discharge: 2023-11-09 | Payer: MEDICARE | Attending: Student in an Organized Health Care Education/Training Program | Primary: Student in an Organized Health Care Education/Training Program

## 2023-11-08 DIAGNOSIS — E162 Hypoglycemia, unspecified: Principal | ICD-10-CM

## 2023-11-08 DIAGNOSIS — E1165 Type 2 diabetes mellitus with hyperglycemia: Principal | ICD-10-CM

## 2023-11-08 MED ORDER — GLIPIZIDE 5 MG TABLET
ORAL_TABLET | Freq: Every day | ORAL | 1 refills | 180.00000 days | Status: CP
Start: 2023-11-08 — End: ?

## 2023-11-08 MED ORDER — FREESTYLE LIBRE 3 PLUS SENSOR DEVICE
ORAL | 0 refills | 0.00000 days | Status: CP
Start: 2023-11-08 — End: ?

## 2023-11-13 DIAGNOSIS — Z8673 Personal history of transient ischemic attack (TIA), and cerebral infarction without residual deficits: Principal | ICD-10-CM

## 2023-11-13 MED ORDER — CLOPIDOGREL 75 MG TABLET
ORAL_TABLET | Freq: Every day | ORAL | 1 refills | 90.00000 days | Status: CP
Start: 2023-11-13 — End: 2024-11-12

## 2023-11-19 DIAGNOSIS — Z8673 Personal history of transient ischemic attack (TIA), and cerebral infarction without residual deficits: Principal | ICD-10-CM

## 2023-11-29 DIAGNOSIS — Z8673 Personal history of transient ischemic attack (TIA), and cerebral infarction without residual deficits: Principal | ICD-10-CM

## 2023-12-06 ENCOUNTER — Ambulatory Visit
Admit: 2023-12-06 | Discharge: 2023-12-07 | Payer: MEDICARE | Attending: Hematology & Oncology | Primary: Hematology & Oncology

## 2023-12-06 DIAGNOSIS — I10 Essential (primary) hypertension: Principal | ICD-10-CM

## 2023-12-06 DIAGNOSIS — E1165 Type 2 diabetes mellitus with hyperglycemia: Principal | ICD-10-CM

## 2023-12-06 DIAGNOSIS — Z8673 Personal history of transient ischemic attack (TIA), and cerebral infarction without residual deficits: Principal | ICD-10-CM

## 2023-12-06 DIAGNOSIS — E7800 Pure hypercholesterolemia, unspecified: Principal | ICD-10-CM

## 2023-12-06 DIAGNOSIS — L821 Other seborrheic keratosis: Principal | ICD-10-CM

## 2023-12-06 DIAGNOSIS — K219 Gastro-esophageal reflux disease without esophagitis: Principal | ICD-10-CM

## 2023-12-06 MED ORDER — ATORVASTATIN 80 MG TABLET
ORAL_TABLET | Freq: Every day | ORAL | 1 refills | 90.00000 days | Status: CP
Start: 2023-12-06 — End: 2024-12-05

## 2023-12-12 DIAGNOSIS — I10 Essential (primary) hypertension: Principal | ICD-10-CM

## 2023-12-12 MED ORDER — AMLODIPINE 5 MG TABLET
ORAL_TABLET | Freq: Two times a day (BID) | ORAL | 1 refills | 45.00000 days | Status: CP
Start: 2023-12-12 — End: ?

## 2023-12-20 DIAGNOSIS — E1165 Type 2 diabetes mellitus with hyperglycemia: Principal | ICD-10-CM

## 2023-12-20 DIAGNOSIS — E162 Hypoglycemia, unspecified: Principal | ICD-10-CM

## 2023-12-20 MED ORDER — OZEMPIC 0.25 MG OR 0.5 MG (2 MG/3 ML) SUBCUTANEOUS PEN INJECTOR
SUBCUTANEOUS | 3 refills | 0.00000 days | Status: CP
Start: 2023-12-20 — End: ?

## 2023-12-20 MED ORDER — FREESTYLE LIBRE 3 PLUS SENSOR DEVICE
ORAL | 11 refills | 0.00000 days | Status: CP
Start: 2023-12-20 — End: ?

## 2023-12-26 MED ORDER — RYBELSUS 3 MG TABLET
ORAL_TABLET | Freq: Every day | ORAL | 3 refills | 30.00000 days | Status: CP
Start: 2023-12-26 — End: ?

## 2023-12-28 DIAGNOSIS — L821 Other seborrheic keratosis: Principal | ICD-10-CM

## 2023-12-28 DIAGNOSIS — L820 Inflamed seborrheic keratosis: Principal | ICD-10-CM

## 2023-12-29 ENCOUNTER — Encounter: Admit: 2023-12-29 | Discharge: 2023-12-30 | Payer: MEDICARE

## 2023-12-29 DIAGNOSIS — F0280 Dementia in other diseases classified elsewhere without behavioral disturbance: Principal | ICD-10-CM

## 2023-12-29 DIAGNOSIS — F05 Delirium due to known physiological condition: Principal | ICD-10-CM

## 2023-12-29 DIAGNOSIS — G309 Alzheimer's disease, unspecified: Principal | ICD-10-CM

## 2023-12-29 MED ORDER — TRAZODONE 50 MG TABLET
ORAL_TABLET | Freq: Every evening | ORAL | 2 refills | 90.00000 days | Status: CP
Start: 2023-12-29 — End: 2024-01-28

## 2024-01-31 DIAGNOSIS — E1165 Type 2 diabetes mellitus with hyperglycemia: Principal | ICD-10-CM

## 2024-01-31 DIAGNOSIS — E162 Hypoglycemia, unspecified: Principal | ICD-10-CM

## 2024-01-31 MED ORDER — GLIPIZIDE 5 MG TABLET
ORAL_TABLET | Freq: Every day | ORAL | 3 refills | 90.00000 days | Status: CP
Start: 2024-01-31 — End: ?

## 2024-01-31 MED ORDER — FREESTYLE LIBRE 3 PLUS SENSOR DEVICE
ORAL | 11 refills | 0.00000 days | Status: CP
Start: 2024-01-31 — End: ?

## 2024-01-31 MED ORDER — METFORMIN 500 MG TABLET
ORAL_TABLET | Freq: Two times a day (BID) | ORAL | 1 refills | 90.00000 days | Status: CP
Start: 2024-01-31 — End: ?

## 2024-01-31 MED ORDER — OZEMPIC 0.25 MG OR 0.5 MG (2 MG/3 ML) SUBCUTANEOUS PEN INJECTOR
SUBCUTANEOUS | 3 refills | 0.00000 days | Status: CP
Start: 2024-01-31 — End: ?

## 2024-02-01 ENCOUNTER — Encounter
Admit: 2024-02-01 | Discharge: 2024-02-01 | Payer: MEDICARE | Attending: Hematology & Oncology | Primary: Hematology & Oncology

## 2024-02-01 DIAGNOSIS — R2241 Localized swelling, mass and lump, right lower limb: Principal | ICD-10-CM

## 2024-02-11 DIAGNOSIS — E1165 Type 2 diabetes mellitus with hyperglycemia: Principal | ICD-10-CM

## 2024-02-12 MED ORDER — METFORMIN 500 MG TABLET
ORAL_TABLET | ORAL | 1 refills | 0.00000 days | Status: CP
Start: 2024-02-12 — End: ?

## 2024-02-14 DIAGNOSIS — I10 Essential (primary) hypertension: Principal | ICD-10-CM

## 2024-02-15 MED ORDER — HYDROCHLOROTHIAZIDE 12.5 MG TABLET
ORAL_TABLET | Freq: Every day | ORAL | 1 refills | 90.00000 days | Status: CP
Start: 2024-02-15 — End: ?

## 2024-03-11 ENCOUNTER — Encounter
Admit: 2024-03-11 | Discharge: 2024-03-11 | Payer: MEDICARE | Attending: Hematology & Oncology | Primary: Hematology & Oncology

## 2024-03-11 DIAGNOSIS — K219 Gastro-esophageal reflux disease without esophagitis: Principal | ICD-10-CM

## 2024-03-11 DIAGNOSIS — I10 Essential (primary) hypertension: Principal | ICD-10-CM

## 2024-03-11 DIAGNOSIS — G301 Alzheimer's disease with late onset: Principal | ICD-10-CM

## 2024-03-11 DIAGNOSIS — E119 Type 2 diabetes mellitus without complications: Principal | ICD-10-CM

## 2024-03-11 DIAGNOSIS — Z8673 Personal history of transient ischemic attack (TIA), and cerebral infarction without residual deficits: Principal | ICD-10-CM

## 2024-03-11 DIAGNOSIS — F02A Mild late onset Alzheimer's dementia without behavioral disturbance, psychotic disturbance, mood disturbance, or anxiety (CMS-HCC): Principal | ICD-10-CM

## 2024-03-11 DIAGNOSIS — L821 Other seborrheic keratosis: Principal | ICD-10-CM

## 2024-03-11 DIAGNOSIS — E7800 Pure hypercholesterolemia, unspecified: Principal | ICD-10-CM

## 2024-03-15 MED ORDER — GLIPIZIDE 5 MG TABLET
ORAL_TABLET | Freq: Every day | ORAL | 3 refills | 90.00000 days
Start: 2024-03-15 — End: ?

## 2024-03-29 ENCOUNTER — Encounter
Admit: 2024-03-29 | Discharge: 2024-03-29 | Payer: MEDICARE | Attending: Student in an Organized Health Care Education/Training Program | Primary: Student in an Organized Health Care Education/Training Program

## 2024-03-29 DIAGNOSIS — E1159 Type 2 diabetes mellitus with other circulatory complications: Principal | ICD-10-CM

## 2024-03-29 DIAGNOSIS — E1165 Type 2 diabetes mellitus with hyperglycemia: Principal | ICD-10-CM

## 2024-03-29 MED ORDER — OZEMPIC 0.25 MG OR 0.5 MG (2 MG/3 ML) SUBCUTANEOUS PEN INJECTOR
SUBCUTANEOUS | 3 refills | 0.00000 days | Status: CP
Start: 2024-03-29 — End: ?

## 2024-03-29 MED ORDER — METFORMIN 500 MG TABLET
ORAL_TABLET | Freq: Two times a day (BID) | ORAL | 4 refills | 90.00000 days | Status: CP
Start: 2024-03-29 — End: ?

## 2024-04-22 DIAGNOSIS — N182 Chronic kidney disease, stage 2 (mild): Principal | ICD-10-CM

## 2024-04-22 DIAGNOSIS — E1122 Type 2 diabetes mellitus with diabetic chronic kidney disease: Principal | ICD-10-CM

## 2024-04-22 MED ORDER — OZEMPIC 0.25 MG OR 0.5 MG (2 MG/3 ML) SUBCUTANEOUS PEN INJECTOR
SUBCUTANEOUS | 3 refills | 0.00000 days | Status: CP
Start: 2024-04-22 — End: ?

## 2024-05-11 DIAGNOSIS — Z8673 Personal history of transient ischemic attack (TIA), and cerebral infarction without residual deficits: Principal | ICD-10-CM

## 2024-05-13 MED ORDER — CLOPIDOGREL 75 MG TABLET
ORAL_TABLET | Freq: Every day | ORAL | 1 refills | 90.00000 days | Status: CP
Start: 2024-05-13 — End: ?

## 2024-05-20 DIAGNOSIS — Z8673 Personal history of transient ischemic attack (TIA), and cerebral infarction without residual deficits: Principal | ICD-10-CM

## 2024-05-20 DIAGNOSIS — R262 Difficulty in walking, not elsewhere classified: Principal | ICD-10-CM

## 2024-05-27 DIAGNOSIS — Z8673 Personal history of transient ischemic attack (TIA), and cerebral infarction without residual deficits: Principal | ICD-10-CM

## 2024-05-28 MED ORDER — ATORVASTATIN 80 MG TABLET
ORAL_TABLET | Freq: Every day | ORAL | 1 refills | 90.00000 days | Status: CP
Start: 2024-05-28 — End: ?

## 2024-06-06 DIAGNOSIS — L814 Other melanin hyperpigmentation: Principal | ICD-10-CM

## 2024-06-06 DIAGNOSIS — L820 Inflamed seborrheic keratosis: Principal | ICD-10-CM

## 2024-06-10 ENCOUNTER — Other Ambulatory Visit: Payer: Self-pay | Admitting: Nurse Practitioner

## 2024-06-10 DIAGNOSIS — I10 Essential (primary) hypertension: Principal | ICD-10-CM

## 2024-06-13 DIAGNOSIS — I10 Essential (primary) hypertension: Principal | ICD-10-CM

## 2024-06-13 MED ORDER — AMLODIPINE 5 MG TABLET
ORAL_TABLET | Freq: Two times a day (BID) | ORAL | 1 refills | 45.00000 days | Status: CP
Start: 2024-06-13 — End: ?

## 2024-06-21 ENCOUNTER — Ambulatory Visit: Admit: 2024-06-21 | Discharge: 2024-06-22 | Payer: MEDICARE

## 2024-06-21 DIAGNOSIS — M810 Age-related osteoporosis without current pathological fracture: Principal | ICD-10-CM

## 2024-06-21 DIAGNOSIS — I10 Essential (primary) hypertension: Principal | ICD-10-CM

## 2024-06-21 DIAGNOSIS — E119 Type 2 diabetes mellitus without complications: Principal | ICD-10-CM

## 2024-06-21 MED ORDER — EMPAGLIFLOZIN 10 MG TABLET
ORAL_TABLET | Freq: Every day | ORAL | 3 refills | 90.00000 days | Status: CP
Start: 2024-06-21 — End: ?

## 2024-06-21 MED ORDER — AMLODIPINE 10 MG TABLET
ORAL_TABLET | Freq: Two times a day (BID) | ORAL | 11 refills | 30.00000 days | Status: CP
Start: 2024-06-21 — End: ?

## 2024-06-21 MED ORDER — METFORMIN ER 500 MG TABLET,EXTENDED RELEASE 24 HR
ORAL_TABLET | Freq: Every evening | ORAL | 0 refills | 1.00000 days | Status: CP
Start: 2024-06-21 — End: 2024-06-22

## 2024-07-01 DIAGNOSIS — G309 Alzheimer's disease, unspecified: Principal | ICD-10-CM

## 2024-07-01 DIAGNOSIS — F0280 Dementia in other diseases classified elsewhere without behavioral disturbance: Principal | ICD-10-CM

## 2024-07-01 MED ORDER — DONEPEZIL 10 MG TABLET
ORAL_TABLET | Freq: Every evening | ORAL | 0 refills | 90.00000 days | Status: CP
Start: 2024-07-01 — End: 2025-07-01

## 2024-07-03 DIAGNOSIS — Z8673 Personal history of transient ischemic attack (TIA), and cerebral infarction without residual deficits: Principal | ICD-10-CM

## 2024-07-05 MED ORDER — METFORMIN ER 500 MG TABLET,EXTENDED RELEASE 24 HR
ORAL_TABLET | Freq: Every day | ORAL | 1 refills | 90.00000 days | Status: CP
Start: 2024-07-05 — End: ?

## 2024-07-05 MED ORDER — CLOPIDOGREL 75 MG TABLET
ORAL_TABLET | Freq: Every day | ORAL | 1 refills | 90.00000 days | Status: CP
Start: 2024-07-05 — End: ?

## 2024-07-05 MED ORDER — ACCU-CHEK GUIDE TEST STRIPS
ORAL_STRIP | Freq: Once | 3 refills | 0.00000 days | Status: CP
Start: 2024-07-05 — End: 2024-07-05

## 2024-07-09 DIAGNOSIS — E119 Type 2 diabetes mellitus without complications: Principal | ICD-10-CM

## 2024-07-09 MED ORDER — ACCU-CHEK GUIDE TEST STRIPS
ORAL_STRIP | SUBCUTANEOUS | 3 refills | 0.00000 days | Status: CP
Start: 2024-07-09 — End: ?

## 2024-07-11 DIAGNOSIS — E119 Type 2 diabetes mellitus without complications: Principal | ICD-10-CM

## 2024-07-11 MED ORDER — ACCU-CHEK GUIDE TEST STRIPS
ORAL_STRIP | SUBCUTANEOUS | 3 refills | 0.00000 days | Status: CP
Start: 2024-07-11 — End: ?
